# Patient Record
Sex: Male | Born: 1975 | Race: Black or African American | Hispanic: No | Marital: Single | Smoking: Current every day smoker
Health system: Southern US, Community
[De-identification: ages and names within clinical notes are randomized; demographics above are authoritative.]

## PROBLEM LIST (undated history)

## (undated) HISTORY — PX: FRACTURE SURGERY: SHX138

---

## 1999-09-23 ENCOUNTER — Encounter: Payer: Self-pay | Admitting: Emergency Medicine

## 1999-09-23 ENCOUNTER — Emergency Department (HOSPITAL_COMMUNITY): Admission: EM | Admit: 1999-09-23 | Discharge: 1999-09-23 | Payer: Self-pay | Admitting: *Deleted

## 2000-06-30 ENCOUNTER — Emergency Department (HOSPITAL_COMMUNITY): Admission: EM | Admit: 2000-06-30 | Discharge: 2000-06-30 | Payer: Self-pay | Admitting: Emergency Medicine

## 2001-05-25 ENCOUNTER — Emergency Department (HOSPITAL_COMMUNITY): Admission: EM | Admit: 2001-05-25 | Discharge: 2001-05-25 | Payer: Self-pay | Admitting: *Deleted

## 2001-06-21 ENCOUNTER — Emergency Department (HOSPITAL_COMMUNITY): Admission: EM | Admit: 2001-06-21 | Discharge: 2001-06-21 | Payer: Self-pay | Admitting: Emergency Medicine

## 2003-03-16 ENCOUNTER — Emergency Department (HOSPITAL_COMMUNITY): Admission: EM | Admit: 2003-03-16 | Discharge: 2003-03-16 | Payer: Self-pay | Admitting: *Deleted

## 2003-03-16 ENCOUNTER — Encounter: Payer: Self-pay | Admitting: *Deleted

## 2003-05-16 ENCOUNTER — Encounter: Payer: Self-pay | Admitting: *Deleted

## 2003-05-16 ENCOUNTER — Emergency Department (HOSPITAL_COMMUNITY): Admission: EM | Admit: 2003-05-16 | Discharge: 2003-05-16 | Payer: Self-pay | Admitting: *Deleted

## 2003-06-24 ENCOUNTER — Encounter: Payer: Self-pay | Admitting: Emergency Medicine

## 2003-06-24 ENCOUNTER — Emergency Department (HOSPITAL_COMMUNITY): Admission: EM | Admit: 2003-06-24 | Discharge: 2003-06-24 | Payer: Self-pay | Admitting: Emergency Medicine

## 2003-11-22 ENCOUNTER — Emergency Department (HOSPITAL_COMMUNITY): Admission: EM | Admit: 2003-11-22 | Discharge: 2003-11-22 | Payer: Self-pay | Admitting: Emergency Medicine

## 2005-12-16 ENCOUNTER — Emergency Department (HOSPITAL_COMMUNITY): Admission: EM | Admit: 2005-12-16 | Discharge: 2005-12-17 | Payer: Self-pay | Admitting: Emergency Medicine

## 2006-11-08 ENCOUNTER — Emergency Department (HOSPITAL_COMMUNITY): Admission: EM | Admit: 2006-11-08 | Discharge: 2006-11-08 | Payer: Self-pay | Admitting: Emergency Medicine

## 2008-02-24 ENCOUNTER — Emergency Department (HOSPITAL_COMMUNITY): Admission: EM | Admit: 2008-02-24 | Discharge: 2008-02-24 | Payer: Self-pay | Admitting: Emergency Medicine

## 2011-05-28 ENCOUNTER — Encounter: Payer: Self-pay | Admitting: *Deleted

## 2011-05-28 ENCOUNTER — Emergency Department (HOSPITAL_COMMUNITY)
Admission: EM | Admit: 2011-05-28 | Discharge: 2011-05-28 | Disposition: A | Discharge status: Home / Self Care | Payer: Self-pay | Attending: Emergency Medicine | Admitting: Emergency Medicine

## 2011-05-28 DIAGNOSIS — L738 Other specified follicular disorders: Secondary | ICD-10-CM | POA: Insufficient documentation

## 2011-05-28 DIAGNOSIS — F17200 Nicotine dependence, unspecified, uncomplicated: Secondary | ICD-10-CM | POA: Insufficient documentation

## 2011-05-28 MED ORDER — DOXYCYCLINE HYCLATE 100 MG PO TABS
100.0000 mg | ORAL_TABLET | Freq: Once | ORAL | Status: AC
  Administered 2011-05-28: 100 mg via ORAL
  Filled 2011-05-28: qty 1

## 2011-05-28 MED ORDER — ACETAMINOPHEN 325 MG PO TABS
650.0000 mg | ORAL_TABLET | Freq: Once | ORAL | Status: AC
  Administered 2011-05-28: 650 mg via ORAL
  Filled 2011-05-28: qty 2

## 2011-05-28 MED ORDER — DOXYCYCLINE HYCLATE 100 MG PO CAPS: 100.0000 mg | ORAL_CAPSULE | Freq: Two times a day (BID) | ORAL | Status: AC

## 2011-05-28 NOTE — ED Provider Notes (Signed)
History     CSN: [id number] Arrival date & time: 05/28/2011  3:14 AM   Chief Complaint  Patient presents with  . Recurrent Skin Infections     (Include location/radiation/quality/duration/timing/severity/associated sxs/prior treatment) HPI Comments: Seen 0438  Patient is a 35 y.o. male presenting with rash. The history is provided by the patient.  Rash  This is a new (patient with raised red area on chil, under beard. Inducrated, no real fluctuance. Raised  center portion of chin lesion) problem. The current episode started yesterday. The problem has been gradually worsening. There has been no fever. The rash is present on the face. The pain is at a severity of 3/10. The pain is mild. Associated symptoms include itching. He has tried nothing for the symptoms.     History reviewed. No pertinent past medical history.   History reviewed. No pertinent past surgical history.  History reviewed. No pertinent family history.  History  Substance Use Topics  . Smoking status: Current Everyday Smoker  . Smokeless tobacco: Not on file  . Alcohol Use: No      Review of Systems  Skin: Positive for itching and rash.    Allergies  Aspirin  Home Medications  No current outpatient prescriptions on file.  Physical Exam    BP 127/69  Pulse 71  Temp(Src) 98.1 F (36.7 C) (Oral)  Resp 20  Ht 6\' 1"  (1.854 m)  Wt 201 lb (91.173 kg)  BMI 26.52 kg/m2  SpO2 99%  Physical Exam  Nursing note and vitals reviewed. Constitutional: He is oriented to person, place, and time. He appears well-developed and well-nourished.  HENT:  Head: Normocephalic and atraumatic.       See skin exam  Eyes: EOM are normal.  Neck: Normal range of motion.  Cardiovascular: Normal rate and normal heart sounds.   Pulmonary/Chest: Effort normal and breath sounds normal.  Abdominal: Soft.  Musculoskeletal: Normal range of motion.  Neurological: He is alert and oriented to person, place, and time.  Skin:         Patient with short beard, several early follicular abscesses beginning.   Psychiatric: He has a normal mood and affect. His behavior is normal.    ED Course  Procedures      Patient with beard folliculitis. Began treatment with antibiotics. No frank abcess fornation. Patient informed of clinical course, understand medical decision-making process, and agree with plan. MDM Reviewed: nursing note and vitals     Nicoletta Dress. Colon Branch, MD 05/28/11 [phone]

## 2011-05-28 NOTE — ED Notes (Signed)
Pt has a abcess under chin. Pt states he was seen at [facility] earlier and had a ct scan of neck and decided to leave despite having results of ct scan because he wanted to be seen here.

## 2012-06-16 ENCOUNTER — Encounter (HOSPITAL_COMMUNITY): Payer: Self-pay

## 2012-06-16 ENCOUNTER — Emergency Department (HOSPITAL_COMMUNITY)
Admission: EM | Admit: 2012-06-16 | Discharge: 2012-06-16 | Disposition: A | Discharge status: Home / Self Care | Payer: No Typology Code available for payment source | Attending: Emergency Medicine | Admitting: Emergency Medicine

## 2012-06-16 ENCOUNTER — Emergency Department (HOSPITAL_COMMUNITY): Payer: No Typology Code available for payment source

## 2012-06-16 DIAGNOSIS — S139XXA Sprain of joints and ligaments of unspecified parts of neck, initial encounter: Secondary | ICD-10-CM | POA: Insufficient documentation

## 2012-06-16 DIAGNOSIS — S161XXA Strain of muscle, fascia and tendon at neck level, initial encounter: Secondary | ICD-10-CM

## 2012-06-16 DIAGNOSIS — IMO0002 Reserved for concepts with insufficient information to code with codable children: Secondary | ICD-10-CM | POA: Insufficient documentation

## 2012-06-16 DIAGNOSIS — S39012A Strain of muscle, fascia and tendon of lower back, initial encounter: Secondary | ICD-10-CM

## 2012-06-16 MED ORDER — OXYCODONE-ACETAMINOPHEN 5-325 MG PO TABS
1.0000 | ORAL_TABLET | Freq: Once | ORAL | Status: AC
  Administered 2012-06-16: 1 via ORAL
  Filled 2012-06-16: qty 1

## 2012-06-16 MED ORDER — CYCLOBENZAPRINE HCL 5 MG PO TABS: 5.0000 mg | ORAL_TABLET | Freq: Three times a day (TID) | ORAL | Status: DC | PRN

## 2012-06-16 MED ORDER — OXYCODONE-ACETAMINOPHEN 5-325 MG PO TABS: 1.0000 | ORAL_TABLET | ORAL | Status: AC | PRN

## 2012-06-16 MED ORDER — IBUPROFEN 600 MG PO TABS: 600.0000 mg | ORAL_TABLET | Freq: Four times a day (QID) | ORAL | Status: DC | PRN

## 2012-06-16 MED ORDER — OXYCODONE-ACETAMINOPHEN 5-325 MG PO TABS: 1.0000 | ORAL_TABLET | Freq: Once | ORAL | Status: DC

## 2012-06-16 MED ORDER — KETOROLAC TROMETHAMINE 60 MG/2ML IM SOLN: 60.0000 mg | Freq: Once | INTRAMUSCULAR | Status: DC
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
## 2012-06-17 NOTE — ED Provider Notes (Signed)
Medical screening examination/treatment/procedure(s) were performed by non-physician practitioner and as supervising physician I was immediately available for consultation/collaboration.   Glynn Octave, MD 06/17/12 0020

## 2012-12-24 ENCOUNTER — Emergency Department (HOSPITAL_COMMUNITY)
Admission: EM | Admit: 2012-12-24 | Discharge: 2012-12-24 | Disposition: A | Discharge status: Home / Self Care | Payer: Self-pay | Attending: Emergency Medicine | Admitting: Emergency Medicine

## 2012-12-24 ENCOUNTER — Encounter (HOSPITAL_COMMUNITY): Payer: Self-pay

## 2012-12-24 DIAGNOSIS — L0291 Cutaneous abscess, unspecified: Secondary | ICD-10-CM

## 2012-12-24 DIAGNOSIS — F17200 Nicotine dependence, unspecified, uncomplicated: Secondary | ICD-10-CM | POA: Insufficient documentation

## 2012-12-24 DIAGNOSIS — IMO0002 Reserved for concepts with insufficient information to code with codable children: Secondary | ICD-10-CM | POA: Insufficient documentation

## 2012-12-24 DIAGNOSIS — Z872 Personal history of diseases of the skin and subcutaneous tissue: Secondary | ICD-10-CM | POA: Insufficient documentation

## 2012-12-24 DIAGNOSIS — K6289 Other specified diseases of anus and rectum: Secondary | ICD-10-CM | POA: Insufficient documentation

## 2012-12-24 MED ORDER — LIDOCAINE-EPINEPHRINE (PF) 1 %-1:200000 IJ SOLN
10.0000 mL | Freq: Once | INTRAMUSCULAR | Status: AC
  Administered 2012-12-24: 10 mL
  Filled 2012-12-24: qty 10

## 2012-12-24 MED ORDER — SULFAMETHOXAZOLE-TRIMETHOPRIM 800-160 MG PO TABS: 1.0000 | ORAL_TABLET | Freq: Two times a day (BID) | ORAL | Status: DC

## 2012-12-24 NOTE — ED Provider Notes (Signed)
History     CSN: [id number]  Arrival date & time 12/24/12  0223   First MD Initiated Contact with Patient 12/24/12 0235      Chief Complaint  Patient presents with  . Abscess    (Consider location/radiation/quality/duration/timing/severity/associated sxs/prior treatment) HPI Hx per PT. L arm pain, swelling and abscess x 1 week, getting worse, sharp pain not radiating. Had L facial abscess recently that he popped and drained and is now better.  He also has some rectal pain, no blood in stools, no fevers ro chills, no N/V/D or constipation, denies painful BMs. Symptoms mod to severe. Pain worse in armpit with any movement or palpation  History reviewed. No pertinent past medical history.  Past Surgical History  Procedure Laterality Date  . Fracture surgery      No family history on file.  History  Substance Use Topics  . Smoking status: Current Every Day Smoker    Types: Cigarettes  . Smokeless tobacco: Not on file  . Alcohol Use: No      Review of Systems  Constitutional: Negative for fever and chills.  HENT: Negative for neck pain and neck stiffness.   Eyes: Negative for pain.  Respiratory: Negative for shortness of breath.   Cardiovascular: Negative for chest pain.  Gastrointestinal: Negative for abdominal pain.  Genitourinary: Negative for dysuria.  Musculoskeletal: Negative for back pain.  Skin: Negative for rash.  Neurological: Negative for headaches.  All other systems reviewed and are negative.    Allergies  Aspirin  Home Medications   Current Outpatient Rx  Name  Route  Sig  Dispense  Refill  . ibuprofen (ADVIL,MOTRIN) 600 MG tablet   Oral   Take 1 tablet (600 mg total) by mouth every 6 (six) hours as needed for pain.   30 tablet   0   . cyclobenzaprine (FLEXERIL) 5 MG tablet   Oral   Take 1 tablet (5 mg total) by mouth 3 (three) times daily as needed for muscle spasms.   15 tablet   0   . sulfamethoxazole-trimethoprim (SEPTRA DS) 800-160  MG per tablet   Oral   Take 1 tablet by mouth every 12 (twelve) hours.   10 tablet   0     BP 124/72  Pulse 81  Temp(Src) 99.2 F (37.3 C) (Oral)  Resp 16  Ht 6\' 1"  (1.854 m)  Wt 210 lb (95.255 kg)  BMI 27.71 kg/m2  SpO2 97%  Physical Exam  Constitutional: He is oriented to person, place, and time. He appears well-developed and well-nourished.  HENT:  Head: Normocephalic and atraumatic.  Eyes: EOM are normal. Pupils are equal, round, and reactive to light.  Neck: Neck supple.  Cardiovascular: Regular rhythm and intact distal pulses.   Pulmonary/Chest: Effort normal. No respiratory distress.  Genitourinary:  Rectal exam: no mass or tenderness, no fissure or ext hemarrhoids  Musculoskeletal: Normal range of motion. He exhibits no edema.  L axilla with large area of fluctuance, tenderness and surrounding erythema, no poinitng  Neurological: He is alert and oriented to person, place, and time.  Skin: Skin is warm and dry.    ED Course  INCISION AND DRAINAGE Date/Time: 12/24/2012 4:00 AM Performed by: Sunnie Nielsen Authorized by: Sunnie Nielsen Consent: Verbal consent obtained. Risks and benefits: risks, benefits and alternatives were discussed Consent given by: patient Patient understanding: patient states understanding of the procedure being performed Patient consent: the patient's understanding of the procedure matches consent given Procedure consent: procedure consent matches procedure  scheduled Required items: required blood products, implants, devices, and special equipment available Patient identity confirmed: verbally with patient Time out: Immediately prior to procedure a "time out" was called to verify the correct patient, procedure, equipment, support staff and site/side marked as required. Type: abscess Location: L axilla. Anesthesia: local infiltration Local anesthetic: lidocaine 1% with epinephrine Anesthetic total: 2 ml Risk factor: underlying major vessel,  underlying major nerve and coagulopathy Scalpel size: 11 Needle gauge: 22 Incision type: single straight Complexity: complex Drainage: purulent Drainage amount: copious Wound treatment: wound left open Patient tolerance: Patient tolerated the procedure well with no immediate complications.   (including critical care time)    1. Abscess    Plan 48 hour recheck, [facility] referral, ABX and warm soaks  MDM  Large abscess L axilla with some cellulitis, recent La face abscess resolved and now rectal pain without discrete abscess by exam. Strict return precautions verbalized as understood, VS and nursing notes reviewed and considered        Sunnie Nielsen, MD 12/24/12 [phone]

## 2012-12-24 NOTE — ED Notes (Signed)
Pt has large swelling to under left arm/armpit for a week.  Pt also states he has a problem with his rectum, is stinging, painful

## 2013-03-11 ENCOUNTER — Encounter (HOSPITAL_COMMUNITY): Payer: Self-pay | Admitting: *Deleted

## 2013-03-11 ENCOUNTER — Emergency Department (HOSPITAL_COMMUNITY)
Admission: EM | Admit: 2013-03-11 | Discharge: 2013-03-11 | Disposition: A | Discharge status: Home / Self Care | Payer: No Typology Code available for payment source | Attending: Emergency Medicine | Admitting: Emergency Medicine

## 2013-03-11 ENCOUNTER — Emergency Department (HOSPITAL_COMMUNITY): Payer: No Typology Code available for payment source

## 2013-03-11 DIAGNOSIS — S139XXA Sprain of joints and ligaments of unspecified parts of neck, initial encounter: Secondary | ICD-10-CM | POA: Insufficient documentation

## 2013-03-11 DIAGNOSIS — F17200 Nicotine dependence, unspecified, uncomplicated: Secondary | ICD-10-CM | POA: Insufficient documentation

## 2013-03-11 DIAGNOSIS — Y92410 Unspecified street and highway as the place of occurrence of the external cause: Secondary | ICD-10-CM | POA: Insufficient documentation

## 2013-03-11 DIAGNOSIS — S161XXA Strain of muscle, fascia and tendon at neck level, initial encounter: Secondary | ICD-10-CM

## 2013-03-11 DIAGNOSIS — Y9389 Activity, other specified: Secondary | ICD-10-CM | POA: Insufficient documentation

## 2013-03-11 MED ORDER — TRAMADOL HCL 50 MG PO TABS: 50.0000 mg | ORAL_TABLET | Freq: Four times a day (QID) | ORAL | Status: DC | PRN
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
## 2013-05-21 ENCOUNTER — Emergency Department (HOSPITAL_COMMUNITY)
Admission: EM | Admit: 2013-05-21 | Discharge: 2013-05-21 | Disposition: A | Discharge status: Home / Self Care | Payer: Self-pay | Attending: Emergency Medicine | Admitting: Emergency Medicine

## 2013-05-21 ENCOUNTER — Encounter (HOSPITAL_COMMUNITY): Payer: Self-pay | Admitting: Emergency Medicine

## 2013-05-21 DIAGNOSIS — Z791 Long term (current) use of non-steroidal anti-inflammatories (NSAID): Secondary | ICD-10-CM | POA: Insufficient documentation

## 2013-05-21 DIAGNOSIS — L03012 Cellulitis of left finger: Secondary | ICD-10-CM

## 2013-05-21 DIAGNOSIS — F17200 Nicotine dependence, unspecified, uncomplicated: Secondary | ICD-10-CM | POA: Insufficient documentation

## 2013-05-21 DIAGNOSIS — IMO0001 Reserved for inherently not codable concepts without codable children: Secondary | ICD-10-CM | POA: Insufficient documentation

## 2013-05-21 DIAGNOSIS — L03019 Cellulitis of unspecified finger: Secondary | ICD-10-CM | POA: Insufficient documentation
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
## 2013-05-21 NOTE — ED Provider Notes (Signed)
Medical screening examination/treatment/procedure(s) were performed by non-physician practitioner and as supervising physician I was immediately available for consultation/collaboration.  Olivia Mackie, MD 05/21/13 0500

## 2013-05-21 NOTE — ED Notes (Signed)
Pt. reports abscess at left distal middle finger onset Friday , also reproted stung by yellow jacket at left thigh with chronic low back pain .
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
## 2014-05-08 ENCOUNTER — Encounter (HOSPITAL_COMMUNITY): Payer: Self-pay | Admitting: Emergency Medicine

## 2014-05-08 ENCOUNTER — Emergency Department (HOSPITAL_COMMUNITY)
Admission: EM | Admit: 2014-05-08 | Discharge: 2014-05-08 | Disposition: A | Discharge status: Home / Self Care | Payer: No Typology Code available for payment source | Attending: Emergency Medicine | Admitting: Emergency Medicine

## 2014-05-08 DIAGNOSIS — Y92410 Unspecified street and highway as the place of occurrence of the external cause: Secondary | ICD-10-CM | POA: Insufficient documentation

## 2014-05-08 DIAGNOSIS — S0993XA Unspecified injury of face, initial encounter: Secondary | ICD-10-CM | POA: Insufficient documentation

## 2014-05-08 DIAGNOSIS — S199XXA Unspecified injury of neck, initial encounter: Secondary | ICD-10-CM | POA: Diagnosis not present

## 2014-05-08 DIAGNOSIS — F17200 Nicotine dependence, unspecified, uncomplicated: Secondary | ICD-10-CM | POA: Insufficient documentation

## 2014-05-08 DIAGNOSIS — M62838 Other muscle spasm: Secondary | ICD-10-CM

## 2014-05-08 DIAGNOSIS — Y9389 Activity, other specified: Secondary | ICD-10-CM | POA: Diagnosis not present

## 2014-05-08 DIAGNOSIS — Z791 Long term (current) use of non-steroidal anti-inflammatories (NSAID): Secondary | ICD-10-CM | POA: Insufficient documentation

## 2014-05-08 DIAGNOSIS — Z8781 Personal history of (healed) traumatic fracture: Secondary | ICD-10-CM | POA: Insufficient documentation

## 2014-05-08 MED ORDER — CYCLOBENZAPRINE HCL 10 MG PO TABS: 10.0000 mg | ORAL_TABLET | Freq: Two times a day (BID) | ORAL | Status: DC | PRN

## 2014-05-08 NOTE — Discharge Instructions (Signed)
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
## 2014-05-09 NOTE — ED Provider Notes (Signed)
Medical screening examination/treatment/procedure(s) were performed by non-physician practitioner and as supervising physician I was immediately available for consultation/collaboration.  Christopher J. Pollina, MD 05/09/14 1847 

## 2014-07-08 ENCOUNTER — Encounter (HOSPITAL_COMMUNITY): Payer: Self-pay | Admitting: Emergency Medicine

## 2014-07-08 ENCOUNTER — Emergency Department (HOSPITAL_COMMUNITY)
Admission: EM | Admit: 2014-07-08 | Discharge: 2014-07-08 | Disposition: A | Discharge status: Home / Self Care | Payer: No Typology Code available for payment source | Attending: Emergency Medicine | Admitting: Emergency Medicine

## 2014-07-08 DIAGNOSIS — B432 Subcutaneous pheomycotic abscess and cyst: Secondary | ICD-10-CM | POA: Insufficient documentation

## 2014-07-08 DIAGNOSIS — Z791 Long term (current) use of non-steroidal anti-inflammatories (NSAID): Secondary | ICD-10-CM | POA: Insufficient documentation

## 2014-07-08 DIAGNOSIS — L723 Sebaceous cyst: Secondary | ICD-10-CM

## 2014-07-08 DIAGNOSIS — L259 Unspecified contact dermatitis, unspecified cause: Secondary | ICD-10-CM | POA: Insufficient documentation

## 2014-07-08 DIAGNOSIS — Z7952 Long term (current) use of systemic steroids: Secondary | ICD-10-CM | POA: Insufficient documentation

## 2014-07-08 DIAGNOSIS — L0201 Cutaneous abscess of face: Secondary | ICD-10-CM | POA: Insufficient documentation

## 2014-07-08 DIAGNOSIS — Z720 Tobacco use: Secondary | ICD-10-CM | POA: Insufficient documentation

## 2014-07-08 MED ORDER — SULFAMETHOXAZOLE-TRIMETHOPRIM 800-160 MG PO TABS: 1.0000 | ORAL_TABLET | Freq: Two times a day (BID) | ORAL | Status: DC

## 2014-07-08 MED ORDER — TRIAMCINOLONE ACETONIDE 0.1 % EX CREA: 1.0000 "application " | TOPICAL_CREAM | Freq: Three times a day (TID) | CUTANEOUS | Status: DC

## 2014-07-08 MED ORDER — LIDOCAINE HCL (PF) 1 % IJ SOLN
5.0000 mL | Freq: Once | INTRAMUSCULAR | Status: AC
  Administered 2014-07-08: 5 mL via INTRADERMAL
  Filled 2014-07-08: qty 5

## 2014-07-08 NOTE — Discharge Instructions (Signed)
Contact Dermatitis Contact dermatitis is a rash that happens when something touches the skin. You touched something that irritates your skin, or you have allergies to something you touched. HOME CARE   Avoid the thing that caused your rash.  Keep your rash away from hot water, soap, sunlight, chemicals, and other things that might bother it.  Do not scratch your rash.  You can take cool baths to help stop itching.  Only take medicine as told by your doctor.  Keep all doctor visits as told. GET HELP RIGHT AWAY IF:   Your rash is not better after 3 days.  Your rash gets worse.  Your rash is puffy (swollen), tender, red, sore, or warm.  You have problems with your medicine. MAKE SURE YOU:   Understand these instructions.  Will watch your condition.  Will get help right away if you are not doing well or get worse. Document Released: 06/25/2009 Document Revised: 11/20/2011 Document Reviewed: 01/31/2011 [facility] Patient Information 2015 [place], [place]. This information is not intended to replace advice given to you by your health care provider. Make sure you discuss any questions you have with your health care provider.  Epidermal Cyst An epidermal cyst is usually a small, painless lump under the skin. Cysts often occur on the face, neck, stomach, chest, or genitals. The cyst may be filled with a bad smelling paste. Do not pop your cyst. Popping the cyst can cause pain and puffiness (swelling). HOME CARE   Only take medicines as told by your doctor.  Take your medicine (antibiotics) as told. Finish it even if you start to feel better. GET HELP RIGHT AWAY IF:  Your cyst is tender, red, or puffy.  You are not getting better, or you are getting worse.  You have any questions or concerns. MAKE SURE YOU:  Understand these instructions.  Will watch your condition.  Will get help right away if you are not doing well or get worse. Document Released: 10/05/2004 Document  Revised: 02/27/2012 Document Reviewed: 03/06/2011 [facility]Exit[facility] Patient Information 2015 [place], [place]. This information is not intended to replace advice given to you by your health care provider. Make sure you discuss any questions you have with your health care provider.

## 2014-07-08 NOTE — ED Notes (Signed)
Swollen area to rt side of face.for 3 mos

## 2014-07-11 NOTE — ED Provider Notes (Signed)
Medical screening examination/treatment/procedure(s) were performed by non-physician practitioner and as supervising physician I was immediately available for consultation/collaboration.   EKG Interpretation None        Shakeena Kafer L Freeda Spivey, MD 07/11/14 1515 

## 2014-07-11 NOTE — ED Provider Notes (Signed)
CSN: [id number]     Arrival date & time 07/08/14  2026 History   First MD Initiated Contact with Patient 07/08/14 2115     Chief Complaint  Patient presents with  . Abscess     (Consider location/radiation/quality/duration/timing/severity/associated sxs/prior Treatment) HPI   Darren White is a 38 y.o. male who presents to the Emergency Department complaining of swollen , painful "knot" to the right side of his face for three months.  He states the swelling and pain will "come and go" but has been worsening for 2 weeks.  He reports having a similar area on the left neck that was drained and has never returned. He denies fever, chills, jaw pain, difficulty opening or closing his mouth.  He has not tried anything for the symptoms.   Patient also reports having a red "patch" to his lower abdomen that he reports is itching, scaling and has been present for months.    History reviewed. No pertinent past medical history. Past Surgical History  Procedure Laterality Date  . Fracture surgery     History reviewed. No pertinent family history. History  Substance Use Topics  . Smoking status: Current Every Day Smoker    Types: Cigarettes  . Smokeless tobacco: Not on file  . Alcohol Use: No    Review of Systems  Constitutional: Negative for fever and chills.  Gastrointestinal: Negative for nausea and vomiting.  Musculoskeletal: Negative for arthralgias and joint swelling.  Skin: Positive for color change.       Abscess   Hematological: Negative for adenopathy.  All other systems reviewed and are negative.     Allergies  Aspirin and Tramadol  Home Medications   Prior to Admission medications   Medication Sig Start Date End Date Taking? Authorizing Provider  cyclobenzaprine (FLEXERIL) 10 MG tablet Take 1 tablet (10 mg total) by mouth 2 (two) times daily as needed for muscle spasms. 05/08/14   Jimmye Normanavid John Smith, NP  naproxen sodium (ANAPROX) 220 MG tablet Take 440 mg by mouth 2  (two) times daily with a meal.    Historical Provider, MD  sulfamethoxazole-trimethoprim (SEPTRA DS) 800-160 MG per tablet Take 1 tablet by mouth 2 (two) times daily. For 10 days 07/08/14   Shanai Lartigue L. Francena Zender, PA-C  triamcinolone cream (KENALOG) 0.1 % Apply 1 application topically 3 (three) times daily. 07/08/14   Asako Saliba L. Avrian Delfavero, PA-C   BP 133/65  Pulse 62  Temp(Src) 98.2 F (36.8 C) (Oral)  Resp 20  Ht 6\' 1"  (1.854 m)  Wt 212 lb (96.163 kg)  BMI 27.98 kg/m2  SpO2 100% Physical Exam  Nursing note and vitals reviewed. Constitutional: He is oriented to person, place, and time. He appears well-developed and well-nourished. No distress.  HENT:  Head: Normocephalic and atraumatic.  Mouth/Throat: Oropharynx is clear and moist.  Neck: Normal range of motion. Neck supple.  Cardiovascular: Normal rate, regular rhythm and normal heart sounds.   No murmur heard. Pulmonary/Chest: Effort normal and breath sounds normal. No respiratory distress.  Neurological: He is alert and oriented to person, place, and time. He exhibits normal muscle tone. Coordination normal.  Skin: Skin is warm and dry. There is erythema.  Localized 2 cm fluctuant nodule to the right cheek.  No surrounding erythema, or drainage.  No facial edema.  Localized erythematous plaque to the lower mid abdomen.  Scaling present and chronic appearing.  No edema.      ED Course  Procedures (including critical care time) Labs Review Labs Reviewed -  No data to display  Imaging Review No results found.   EKG Interpretation None     INCISION AND DRAINAGE Performed by: Maxwell CaulRIPLETT,Emelin Dascenzo L. Consent: Verbal consent obtained. Risks and benefits: risks, benefits and alternatives were discussed Type: abscess  Body area: right face  Anesthesia: local infiltration  Incision was made with a #11 scalpel.  Local anesthetic: lidocaine 1 % w/o epinephrine  Anesthetic total: 1 ml  Complexity: complex Blunt dissection to break up  loculations  Drainage: purulent  Drainage amount: moderate  Packing material: none  Patient tolerance: Patient tolerated the procedure well with no immediate complications.    MDM   Final diagnoses:  Sebaceous cyst  Contact dermatitis    Pt is well appearing, non-toxic.  Sebaceous cyst of the skin to the right face and likely nickel dermatitis to the lower abdomen given that area is exposed to his metal belt buckle.  Rx for triamcinolone and bactrim.  Advised to return here if needed.      Cai Flott L. Dainel Arcidiacono, PA-C 07/11/14 1406

## 2014-08-20 ENCOUNTER — Emergency Department (HOSPITAL_COMMUNITY)
Admission: EM | Admit: 2014-08-20 | Discharge: 2014-08-20 | Disposition: A | Discharge status: Home / Self Care | Payer: No Typology Code available for payment source | Attending: Emergency Medicine | Admitting: Emergency Medicine

## 2014-08-20 ENCOUNTER — Encounter (HOSPITAL_COMMUNITY): Payer: Self-pay

## 2014-08-20 DIAGNOSIS — K610 Anal abscess: Secondary | ICD-10-CM | POA: Insufficient documentation

## 2014-08-20 DIAGNOSIS — Z720 Tobacco use: Secondary | ICD-10-CM | POA: Insufficient documentation

## 2014-08-20 MED ORDER — PROMETHAZINE HCL 12.5 MG PO TABS
12.5000 mg | ORAL_TABLET | Freq: Once | ORAL | Status: AC
  Administered 2014-08-20: 12.5 mg via ORAL
  Filled 2014-08-20: qty 1

## 2014-08-20 MED ORDER — CIPROFLOXACIN HCL 500 MG PO TABS: 500.0000 mg | ORAL_TABLET | Freq: Two times a day (BID) | ORAL | Status: DC

## 2014-08-20 MED ORDER — METRONIDAZOLE 500 MG PO TABS: 500.0000 mg | ORAL_TABLET | Freq: Two times a day (BID) | ORAL | Status: DC

## 2014-08-20 MED ORDER — DOXYCYCLINE HYCLATE 100 MG PO TABS
100.0000 mg | ORAL_TABLET | Freq: Once | ORAL | Status: AC
Start: 2014-08-20 — End: 2014-08-20
  Administered 2014-08-20: 100 mg via ORAL
  Filled 2014-08-20: qty 1

## 2014-08-20 MED ORDER — CIPROFLOXACIN HCL 250 MG PO TABS
500.0000 mg | ORAL_TABLET | Freq: Once | ORAL | Status: AC
  Administered 2014-08-20: 500 mg via ORAL
  Filled 2014-08-20: qty 2

## 2014-08-20 MED ORDER — HYDROCODONE-ACETAMINOPHEN 7.5-325 MG PO TABS: 1.0000 | ORAL_TABLET | ORAL | Status: DC | PRN

## 2014-08-20 MED ORDER — METRONIDAZOLE 500 MG PO TABS
500.0000 mg | ORAL_TABLET | Freq: Once | ORAL | Status: AC
  Administered 2014-08-20: 500 mg via ORAL
  Filled 2014-08-20: qty 1

## 2014-08-20 MED ORDER — ONDANSETRON HCL 4 MG PO TABS
4.0000 mg | ORAL_TABLET | Freq: Once | ORAL | Status: AC
  Administered 2014-08-20: 4 mg via ORAL
  Filled 2014-08-20: qty 1

## 2014-08-20 MED ORDER — MORPHINE SULFATE 10 MG/ML IJ SOLN
10.0000 mg | Freq: Once | INTRAMUSCULAR | Status: AC
  Administered 2014-08-20: 10 mg via INTRAMUSCULAR
  Filled 2014-08-20: qty 1

## 2014-08-20 NOTE — ED Notes (Signed)
Patient c/o of boil/hemorrhoid which first appeared 08/14/14. Pt c/o of pain/soreness in the area which prevents him from sitting down. Denies any blood after BMs, denies itching. Pt used Prep H cream and suppository with no relief.

## 2014-08-20 NOTE — Care Management Note (Signed)
ED/CM noted patient did not have health insurance and/or PCP listed in the computer.  Patient was asleep. [facility] resource handout with information on the clinics, food pantries, and the handout for new health insurance sign-up were left at bedside for the patient, along with a Rx discount card.

## 2014-08-20 NOTE — Discharge Instructions (Signed)
Please soak in a tub of warm Epsom salt water twice a day until this issue resolves. Please call Dr. Lovell SheehanJenkins tomorrow for appointment in the office on Tuesday. Please use Tylenol for mild pain, use Norco for more severe pain. Please use stool softener of your choice until this issue has resolved. Please use Cipro 2 times daily and Flagyl 2 times daily with food. Please do not use any form of alcohol while taking the Flagyl. Please return to the emergency department if any high fever, unusual fatigue or weakness, or signs of advancing infection. Perianal Abscess An abscess is an infected area that contains a collection of pus and debris. A perianal abscess is one that occurs in the perineal area, which is the area between the anus and the scrotum in males and between the anus and the vagina in females. Perianal abscesses can vary in size. Without treatment, a perianal abscess can become larger and cause other problems. CAUSES  Glands in the perineal area can become plugged up with debris. When this happens, an abscess may form.  SIGNS AND SYMPTOMS  The most common symptoms of a perianal abscess are:  Swelling and redness in the area of the abscess. The redness may go beyond the abscess and appear as a red streak on the skin.  Pain in the area of the abscess. Other possible symptoms include:   A visible lump or a lump that can be felt when touching the area and is usually painful.  Bleeding or pus-like discharge from the area.  Fever.  General weakness. DIAGNOSIS  Your health care provider will take a medical history and examine the area. This may involve examining the rectal area with a gloved hand (digital rectal exam). For women, it may require a careful vaginal exam. Sometimes, the health care provider needs to look into the rectum using a probe or scope. TREATMENT  Treatment often requires making a cut (incision) in the abscess to drain the pus. This can sometimes be done in your health  care provider's office or an emergency department after giving you medicine to numb the area (local anesthetic). For larger or deeper abscesses, surgery may be required to drain the abscess. Antibiotic medicines are sometimes given if there is infection of the surrounding tissue (cellulitis). In some cases, gauze is packed into the abscess to continue draining the area. Frequent sitz baths may be recommended to help the wound heal and to reduce the chance of the abscess coming back. HOME CARE INSTRUCTIONS   Only take over-the-counter or prescription medicines for pain, fever, or discomfort as directed by your health care provider.  Take antibiotic medicine as directed. Make sure you finish it even if you start to feel better.  If gauze is used in the abscess, follow your health care provider's instructions for removing or changing the gauze. It can usually be removed in 2-3 days.  If one or more drains have been placed in the abscess cavity, be careful not to pull at them. Your health care provider will tell you how long they need to remain in place.  Take warm sitz baths 3-4 times a day and after bowel movements. This will help reduce pain and swelling.  Keep the skin around the abscess clean and dry. Avoid cleaning the area too much.  Avoid scratching the abscess area.  Avoid using colored or perfumed toilet papers. SEEK MEDICAL CARE IF:   You have trouble having a bowel movement or passing urine.  Your pain or  swelling in the affected area does not seem to be improving.  The gauze packing or the drains come out before the planned time. SEEK IMMEDIATE MEDICAL CARE IF:   You have problems moving or using your legs.  You have severe or increasing pain.  Your swelling in the affected area suddenly gets worse.  You have a large increase in bleeding or passing of pus.  You have chills or a fever. MAKE SURE YOU:   Understand these instructions.  Will watch your condition.  Will  get help right away if you are not doing well or get worse. Document Released: 10/04/2006 Document Revised: 06/18/2013 Document Reviewed: 04/09/2013 [facility]Exit[facility] Patient Information 2015 [place]Exit[place], [place]. This information is not intended to replace advice given to you by your health care provider. Make sure you discuss any questions you have with your health care provider.

## 2014-08-20 NOTE — ED Provider Notes (Signed)
CSN: [id number]     Arrival date & time 08/20/14  1230 History   First MD Initiated Contact with Patient 08/20/14 1430     Chief Complaint  Patient presents with  . Abscess     (Consider location/radiation/quality/duration/timing/severity/associated sxs/prior Treatment) Patient is a 38 y.o. male presenting with abscess. The history is provided by the patient.  Abscess Location:  Ano-genital Ano-genital abscess location:  Anus Duration:  5 days Progression:  Worsening Chronicity:  New Context: not diabetes and not immunosuppression   Relieved by:  Nothing Exacerbated by: sitting, BM, or palpation. Associated symptoms: no fever and no vomiting   Risk factors: no hx of MRSA and no prior abscess     History reviewed. No pertinent past medical history. Past Surgical History  Procedure Laterality Date  . Fracture surgery     History reviewed. No pertinent family history. History  Substance Use Topics  . Smoking status: Current Every Day Smoker    Types: Cigarettes  . Smokeless tobacco: Not on file  . Alcohol Use: No    Review of Systems  Constitutional: Negative for fever and activity change.       All ROS Neg except as noted in HPI  HENT: Negative for nosebleeds.   Eyes: Negative for photophobia and discharge.  Respiratory: Negative for cough, shortness of breath and wheezing.   Cardiovascular: Negative for chest pain and palpitations.  Gastrointestinal: Negative for vomiting, abdominal pain and blood in stool.  Genitourinary: Negative for dysuria, frequency and hematuria.       Anal pain  Musculoskeletal: Negative for back pain, arthralgias and neck pain.  Skin: Negative.   Neurological: Negative for dizziness, seizures and speech difficulty.  Psychiatric/Behavioral: Negative for hallucinations and confusion.      Allergies  Aspirin and Tramadol  Home Medications   Prior to Admission medications   Medication Sig Start Date End Date Taking? Authorizing  Provider  cyclobenzaprine (FLEXERIL) 10 MG tablet Take 1 tablet (10 mg total) by mouth 2 (two) times daily as needed for muscle spasms. Patient not taking: Reported on 08/20/2014 05/08/14   Jimmye Normanavid John Smith, NP  naproxen sodium (ANAPROX) 220 MG tablet Take 440 mg by mouth 2 (two) times daily as needed (pain).     Historical Provider, MD  sulfamethoxazole-trimethoprim (SEPTRA DS) 800-160 MG per tablet Take 1 tablet by mouth 2 (two) times daily. For 10 days Patient not taking: Reported on 08/20/2014 07/08/14   Tammy L. Triplett, PA-C  triamcinolone cream (KENALOG) 0.1 % Apply 1 application topically 3 (three) times daily. Patient not taking: Reported on 08/20/2014 07/08/14   Tammy L. Triplett, PA-C   BP 115/78 mmHg  Pulse 76  Temp(Src) 99.5 F (37.5 C) (Oral)  Resp 16  Ht 6\' 1"  (1.854 m)  Wt 210 lb (95.255 kg)  BMI 27.71 kg/m2  SpO2 100% Physical Exam  Constitutional: He is oriented to person, place, and time. He appears well-developed and well-nourished.  Non-toxic appearance.  HENT:  Head: Normocephalic.  Right Ear: Tympanic membrane and external ear normal.  Left Ear: Tympanic membrane and external ear normal.  Eyes: EOM and lids are normal. Pupils are equal, round, and reactive to light.  Neck: Normal range of motion. Neck supple. Carotid bruit is not present.  Cardiovascular: Normal rate, regular rhythm, normal heart sounds, intact distal pulses and normal pulses.   Pulmonary/Chest: Breath sounds normal. No respiratory distress.  Abdominal: Soft. Bowel sounds are normal. There is no tenderness. There is no guarding.  Genitourinary:  Perirectal abscess noted at the 2:00 to 4:00 position. Tender to palpation. No red streaking noted. The abscess area is draining.  Musculoskeletal: Normal range of motion.  Lymphadenopathy:       Head (right side): No submandibular adenopathy present.       Head (left side): No submandibular adenopathy present.    He has no cervical adenopathy.    Neurological: He is alert and oriented to person, place, and time. He has normal strength. No cranial nerve deficit or sensory deficit.  Skin: Skin is warm and dry.  Psychiatric: He has a normal mood and affect. His speech is normal.  Nursing note and vitals reviewed.   ED Course  Case discussed with Dr Lovell SheehanJenkins. Pt to be placed on flagyl and cipro. He will see pt in the office unless symptoms change and pt need to return to the ED sooner.  Procedures (including critical care time) Labs Review Labs Reviewed - No data to display  Imaging Review No results found.   EKG Interpretation None      MDM  Temp 99.5, vitals o/w wnl.  Perianal abscess noted with some drainage. Pt awake and alert. Non-toxic appearing. Pt to see Dr Lovell SheehanJenkins in the office for management. He will return to the ED if condition worsens. Rx for norco 7.5 , cipro, and flagyl given to the patient. Pt to use warm tub soaks 2 or 3 times daily.   Final diagnoses:  Perianal abscess    **I have reviewed nursing notes, vital signs, and all appropriate lab and imaging results for this patient.Kathie Dike*    Wilburta Milbourn M Zach Tietje, PA-C 08/21/14 1701  Donnetta HutchingBrian Cook, MD 08/22/14 [phone]

## 2014-08-23 ENCOUNTER — Emergency Department (HOSPITAL_COMMUNITY)
Admission: EM | Admit: 2014-08-23 | Discharge: 2014-08-23 | Disposition: A | Discharge status: Home / Self Care | Payer: No Typology Code available for payment source | Attending: Emergency Medicine | Admitting: Emergency Medicine

## 2014-08-23 ENCOUNTER — Encounter (HOSPITAL_COMMUNITY): Payer: Self-pay

## 2014-08-23 DIAGNOSIS — Z791 Long term (current) use of non-steroidal anti-inflammatories (NSAID): Secondary | ICD-10-CM | POA: Insufficient documentation

## 2014-08-23 DIAGNOSIS — Z792 Long term (current) use of antibiotics: Secondary | ICD-10-CM | POA: Insufficient documentation

## 2014-08-23 DIAGNOSIS — K611 Rectal abscess: Secondary | ICD-10-CM

## 2014-08-23 DIAGNOSIS — Z720 Tobacco use: Secondary | ICD-10-CM | POA: Insufficient documentation

## 2014-08-23 DIAGNOSIS — Z7952 Long term (current) use of systemic steroids: Secondary | ICD-10-CM | POA: Insufficient documentation

## 2014-08-23 NOTE — ED Notes (Signed)
Patient seen here on 12/10 diagnosed with perirectal abscess. Per patient started on 12/04. Patient reports abscess has increased in size and now he is able to have BM or void. Patient to have surgery on Tuesday but states "I can't wait that long. Patient unable to sit in triage.

## 2014-08-23 NOTE — ED Provider Notes (Signed)
CSN: [id number]     Arrival date & time 08/23/14  1331 History  This chart was scribed for Toy BakerAnthony T Maxen Rowland, MD by Tonye RoyaltyJoshua Chen, ED Scribe. This patient was seen in room APA03/APA03 and the patient's care was started at 3:15 PM.    Chief Complaint  Patient presents with  . Abscess   The history is provided by the patient. No language interpreter was used.    HPI Comments: Darren White is a 38 y.o. male who presents to the Emergency Department complaining of perianal abscess with onset 9 days ago. He was evaluated here for it 3 days ago; he did not have any imaging studies at that time. He is scheduled for day surgery with Dr. Lovell SheehanJenkins in 2 days. He states he returned because he is having continued pain, increase in abscess size, and inability to have a BM. He states it has not been draining at home. He states he has been using the antibiotics prescribed to him from here. He notes he has hot flashes but denies persistent fever or chills.  History reviewed. No pertinent past medical history. Past Surgical History  Procedure Laterality Date  . Fracture surgery     No family history on file. History  Substance Use Topics  . Smoking status: Current Every Day Smoker    Types: Cigarettes  . Smokeless tobacco: Not on file  . Alcohol Use: No    Review of Systems  Constitutional: Negative for fever and chills.  Genitourinary:       Perianal abscess      Allergies  Aspirin and Tramadol  Home Medications   Prior to Admission medications   Medication Sig Start Date End Date Taking? Authorizing Provider  ciprofloxacin (CIPRO) 500 MG tablet Take 1 tablet (500 mg total) by mouth 2 (two) times daily. 08/20/14   Kathie DikeHobson M Bryant, PA-C  cyclobenzaprine (FLEXERIL) 10 MG tablet Take 1 tablet (10 mg total) by mouth 2 (two) times daily as needed for muscle spasms. Patient not taking: Reported on 08/20/2014 05/08/14   Jimmye Normanavid John Smith, NP  HYDROcodone-acetaminophen [facility](NORCO) 7.5-325 MG per tablet  Take 1 tablet by mouth every 4 (four) hours as needed. 08/20/14   Kathie DikeHobson M Bryant, PA-C  metroNIDAZOLE (FLAGYL) 500 MG tablet Take 1 tablet (500 mg total) by mouth 2 (two) times daily. 08/20/14   Kathie DikeHobson M Bryant, PA-C  naproxen sodium (ANAPROX) 220 MG tablet Take 440 mg by mouth 2 (two) times daily as needed (pain).     Historical Provider, MD  sulfamethoxazole-trimethoprim (SEPTRA DS) 800-160 MG per tablet Take 1 tablet by mouth 2 (two) times daily. For 10 days Patient not taking: Reported on 08/20/2014 07/08/14   Tammy L. Triplett, PA-C  triamcinolone cream (KENALOG) 0.1 % Apply 1 application topically 3 (three) times daily. Patient not taking: Reported on 08/20/2014 07/08/14   Tammy L. Triplett, PA-C   BP 146/89 mmHg  Pulse 74  Temp(Src) 98.6 F (37 C) (Oral)  Resp 20  Ht 6\' 1"  (1.854 m)  Wt 210 lb (95.255 kg)  BMI 27.71 kg/m2  SpO2 100% Physical Exam  Constitutional: He is oriented to person, place, and time. He appears well-developed and well-nourished.  Non-toxic appearance. No distress.  HENT:  Head: Normocephalic and atraumatic.  Eyes: Conjunctivae, EOM and lids are normal. Pupils are equal, round, and reactive to light.  Neck: Normal range of motion. Neck supple. No tracheal deviation present. No thyroid mass present.  Cardiovascular: Normal rate, regular rhythm and normal heart  sounds.  Exam reveals no gallop.   No murmur heard. Pulmonary/Chest: Effort normal and breath sounds normal. No stridor. No respiratory distress. He has no decreased breath sounds. He has no wheezes. He has no rhonchi. He has no rales.  Abdominal: Soft. Normal appearance and bowel sounds are normal. He exhibits no distension. There is no tenderness. There is no rebound and no CVA tenderness.  Genitourinary:  At the 4 io clock region of the perianal area active copuiouis amounts of purulent drainage withotu aurrounding erythema oit crepitus noted  Musculoskeletal: Normal range of motion. He exhibits no  edema or tenderness.  Neurological: He is alert and oriented to person, place, and time. He has normal strength. No cranial nerve deficit or sensory deficit. GCS eye subscore is 4. GCS verbal subscore is 5. GCS motor subscore is 6.  Skin: Skin is warm and dry. No abrasion and no rash noted.  Psychiatric: He has a normal mood and affect. His speech is normal and behavior is normal.  Nursing note and vitals reviewed.   ED Course  Procedures (including critical care time)  DIAGNOSTIC STUDIES: Oxygen Saturation is 100% on room air, normal by my interpretation.    COORDINATION OF CARE: 3:24 PM Abscess began draining during exam. Discussed treatment plan with patient at beside, including consult with Dr. Lovell SheehanJenkins. The patient agrees with the plan and has no further questions at this time.   Labs Review Labs Reviewed - No data to display  Imaging Review No results found.   EKG Interpretation None      MDM   Final diagnoses:  None    I personally performed the services described in this documentation, which was scribed in my presence. The recorded information has been reviewed and is accurate.  Spoke with general surgeon on call, Dr. Lovell SheehanJenkins, and he will see the patient in 2 days for follow-up.  Toy BakerAnthony T Daisy Mcneel, MD 08/23/14 [phone]

## 2014-08-23 NOTE — Discharge Instructions (Signed)
Keep your appointment with Dr. Lovell SheehanJenkins for Tuesday Peri-Rectal Abscess Your caregiver has diagnosed you as having a peri-rectal abscess. This is an infected area near the rectum that is filled with pus. If the abscess is near the surface of the skin, your caregiver may open (incise) the area and drain the pus. HOME CARE INSTRUCTIONS   If your abscess was opened up and drained. A small piece of gauze may be placed in the opening so that it can drain. Do not remove the gauze unless directed by your caregiver.  A loose dressing may be placed over the abscess site. Change the dressing as often as necessary to keep it clean and dry.  After the drain is removed, the area may be washed with a gentle antiseptic (soap) four times per day.  A warm sitz bath, warm packs or heating pad may be used for pain relief, taking care not to burn yourself.  Return for a wound check in 1 day or as directed.  An "inflatable doughnut" may be used for sitting with added comfort. These can be purchased at a drugstore or medical supply house.  To reduce pain and straining with bowel movements, eat a high fiber diet with plenty of fruits and vegetables. Use stool softeners as recommended by your caregiver. This is especially important if narcotic type pain medications were prescribed as these may cause marked constipation.  Only take over-the-counter or prescription medicines for pain, discomfort, or fever as directed by your caregiver. SEEK IMMEDIATE MEDICAL CARE IF:   You have increasing pain that is not controlled by medication.  There is increased inflammation (redness), swelling, bleeding, or drainage from the area.  An oral temperature above 102 F (38.9 C) develops.  You develop chills or generalized malaise (feel lethargic or feel "washed out").  You develop any new symptoms (problems) you feel may be related to your present problem. Document Released: 08/25/2000 Document Revised: 11/20/2011 Document  Reviewed: 08/25/2008 [facility] Patient Information 2015 [place]. This information is not intended to replace advice given to you by your health care provider. Make sure you discuss any questions you have with your health care provider.

## 2015-03-24 ENCOUNTER — Emergency Department (HOSPITAL_COMMUNITY)
Admission: EM | Admit: 2015-03-24 | Discharge: 2015-03-25 | Disposition: A | Discharge status: Home / Self Care | Payer: Self-pay | Attending: Emergency Medicine | Admitting: Emergency Medicine

## 2015-03-24 ENCOUNTER — Encounter (HOSPITAL_COMMUNITY): Payer: Self-pay | Admitting: Emergency Medicine

## 2015-03-24 DIAGNOSIS — Z720 Tobacco use: Secondary | ICD-10-CM | POA: Insufficient documentation

## 2015-03-24 DIAGNOSIS — K611 Rectal abscess: Secondary | ICD-10-CM | POA: Insufficient documentation

## 2015-03-24 MED ORDER — OXYCODONE-ACETAMINOPHEN 5-325 MG PO TABS
1.0000 | ORAL_TABLET | Freq: Once | ORAL | Status: AC
  Administered 2015-03-24: 1 via ORAL
  Filled 2015-03-24: qty 1

## 2015-03-24 MED ORDER — LIDOCAINE HCL (PF) 2 % IJ SOLN
10.0000 mL | Freq: Once | INTRAMUSCULAR | Status: AC
  Administered 2015-03-24: 10 mL via INTRADERMAL
  Filled 2015-03-24: qty 10

## 2015-03-24 MED ORDER — SULFAMETHOXAZOLE-TRIMETHOPRIM 800-160 MG PO TABS
1.0000 | ORAL_TABLET | Freq: Once | ORAL | Status: AC
  Administered 2015-03-24: 1 via ORAL
  Filled 2015-03-24: qty 1

## 2015-03-24 MED ORDER — OXYCODONE-ACETAMINOPHEN 5-325 MG PO TABS: 1.0000 | ORAL_TABLET | ORAL | Status: DC | PRN

## 2015-03-24 MED ORDER — SULFAMETHOXAZOLE-TRIMETHOPRIM 800-160 MG PO TABS: 1.0000 | ORAL_TABLET | Freq: Two times a day (BID) | ORAL | Status: AC

## 2015-03-24 NOTE — Discharge Instructions (Signed)
Peri-Rectal Abscess  Your caregiver has diagnosed you as having a peri-rectal abscess. This is an infected area near the rectum that is filled with pus. If the abscess is near the surface of the skin, your caregiver may open (incise) the area and drain the pus.  HOME CARE INSTRUCTIONS    If your abscess was opened up and drained. A small piece of gauze may be placed in the opening so that it can drain. Do not remove the gauze unless directed by your caregiver.   A loose dressing may be placed over the abscess site. Change the dressing as often as necessary to keep it clean and dry.   After the drain is removed, the area may be washed with a gentle antiseptic (soap) four times per day.   A warm sitz bath, warm packs or heating pad may be used for pain relief, taking care not to burn yourself.   Return for a wound check in 1 day or as directed.   An "inflatable doughnut" may be used for sitting with added comfort. These can be purchased at a drugstore or medical supply house.   To reduce pain and straining with bowel movements, eat a high fiber diet with plenty of fruits and vegetables. Use stool softeners as recommended by your caregiver. This is especially important if narcotic type pain medications were prescribed as these may cause marked constipation.   Only take over-the-counter or prescription medicines for pain, discomfort, or fever as directed by your caregiver.  SEEK IMMEDIATE MEDICAL CARE IF:    You have increasing pain that is not controlled by medication.   There is increased inflammation (redness), swelling, bleeding, or drainage from the area.   An oral temperature above 102 F (38.9 C) develops.   You develop chills or generalized malaise (feel lethargic or feel "washed out").   You develop any new symptoms (problems) you feel may be related to your present problem.  Document Released: 08/25/2000 Document Revised: 11/20/2011 Document Reviewed: 08/25/2008  ExitCare Patient Information  2015 [employer]. This information is not intended to replace advice given to you by your health care provider. Make sure you discuss any questions you have with your health care provider.

## 2015-03-24 NOTE — ED Notes (Signed)
Pt c/o abscess to the rectum x one week.

## 2015-03-24 NOTE — ED Provider Notes (Signed)
CSN: [id number]     Arrival date & time 03/24/15  2216 History   First MD Initiated Contact with Patient 03/24/15 2235     Chief Complaint  Patient presents with  . Abscess     (Consider location/radiation/quality/duration/timing/severity/associated sxs/prior Treatment) HPI   Curly RimDaniel L Colocho is a 39 y.o. male who presents to the Emergency Department complaining of abscess to his rectum for one week.  He reports history of same last winter with spontaneous drainage and resolution after starting antibiotics. He was seen by a general surgeon for follow-up but did not have any surgical procedures.  Tonight, he reports increasing pain to his rectum with sitting.  He denies fever, chills, abdominal pain, vomiting and redness.  He also denies immunosuppression and diabetes.   History reviewed. No pertinent past medical history. Past Surgical History  Procedure Laterality Date  . Fracture surgery     No family history on file. History  Substance Use Topics  . Smoking status: Current Every Day Smoker    Types: Cigarettes  . Smokeless tobacco: Not on file  . Alcohol Use: Yes    Review of Systems  Constitutional: Negative for fever and chills.  Gastrointestinal: Negative for nausea and vomiting.  Musculoskeletal: Negative for joint swelling and arthralgias.  Skin: Positive for color change.       Abscess   Hematological: Negative for adenopathy.  All other systems reviewed and are negative.     Allergies  Aspirin and Tramadol  Home Medications   Prior to Admission medications   Medication Sig Start Date End Date Taking? Authorizing Provider  ciprofloxacin (CIPRO) 500 MG tablet Take 1 tablet (500 mg total) by mouth 2 (two) times daily. Patient not taking: Reported on 03/24/2015 08/20/14   Ivery QualeHobson Bryant, PA-C  cyclobenzaprine (FLEXERIL) 10 MG tablet Take 1 tablet (10 mg total) by mouth 2 (two) times daily as needed for muscle spasms. Patient not taking: Reported on 08/20/2014  05/08/14   Felicie Mornavid Smith, NP  HYDROcodone-acetaminophen (NORCO) 7.5-325 MG per tablet Take 1 tablet by mouth every 4 (four) hours as needed. Patient not taking: Reported on 03/24/2015 08/20/14   Ivery QualeHobson Bryant, PA-C  metroNIDAZOLE (FLAGYL) 500 MG tablet Take 1 tablet (500 mg total) by mouth 2 (two) times daily. Patient not taking: Reported on 03/24/2015 08/20/14   Ivery QualeHobson Bryant, PA-C  naproxen sodium (ANAPROX) 220 MG tablet Take 440 mg by mouth 2 (two) times daily as needed (pain).     Historical Provider, MD  sulfamethoxazole-trimethoprim (SEPTRA DS) 800-160 MG per tablet Take 1 tablet by mouth 2 (two) times daily. For 10 days Patient not taking: Reported on 08/20/2014 07/08/14   Quanda Pavlicek, PA-C  triamcinolone cream (KENALOG) 0.1 % Apply 1 application topically 3 (three) times daily. Patient not taking: Reported on 08/20/2014 07/08/14   Grabiel Schmutz, PA-C   BP 113/79 mmHg  Pulse 69  Temp(Src) 98.1 F (36.7 C) (Oral)  Resp 20  Ht 6\' 1"  (1.854 m)  Wt 210 lb (95.255 kg)  BMI 27.71 kg/m2  SpO2 100% Physical Exam  Constitutional: He is oriented to person, place, and time. He appears well-developed and well-nourished. No distress.  HENT:  Head: Normocephalic and atraumatic.  Cardiovascular: Normal rate, regular rhythm and normal heart sounds.   No murmur heard. Pulmonary/Chest: Effort normal and breath sounds normal. No respiratory distress.  Genitourinary:  Localized fluctuance at 4:00 o clock position to the right of the anus.  No drainage or erythema  Neurological: He is alert and oriented  to person, place, and time. He exhibits normal muscle tone. Coordination normal.  Skin: Skin is warm and dry. No erythema.  Nursing note and vitals reviewed.   ED Course  Procedures (including critical care time) Labs Review Labs Reviewed - No data to display  Imaging Review No results found.   EKG Interpretation None       INCISION AND DRAINAGE Performed by: Maxwell Caul. Consent: Verbal consent obtained. Risks and benefits: risks, benefits and alternatives were discussed Type: abscess  Body area: perirectal  Anesthesia: local infiltration  Incision was made with a #11 scalpel.  Local anesthetic: lidocaine 2 % w/o epinephrine  Anesthetic total: 2 ml  Complexity: complex Blunt dissection to break up loculations  Drainage: purulent  Drainage amount: moderate  Packing material: none  Patient tolerance: Patient tolerated the procedure well with no immediate complications.     MDM   Final diagnoses:  Perirectal abscess    Pt is well appearing.  Non-toxic.  Vitals stable.  Recurrent perirectal abscess with successful I&D.  Pt has seen general surgeon, Dr. Lovell Sheehan previously for same.  Agrees to arrange f/u. Warm soaks     Pauline Aus, PA-C 03/24/15 2356  Donnetta Hutching, MD 03/26/15 1351

## 2016-07-02 ENCOUNTER — Emergency Department (HOSPITAL_COMMUNITY)
Admission: EM | Admit: 2016-07-02 | Discharge: 2016-07-02 | Disposition: A | Discharge status: Home / Self Care | Payer: Self-pay | Attending: Emergency Medicine | Admitting: Emergency Medicine

## 2016-07-02 ENCOUNTER — Encounter (HOSPITAL_COMMUNITY): Payer: Self-pay | Admitting: Emergency Medicine

## 2016-07-02 DIAGNOSIS — K611 Rectal abscess: Secondary | ICD-10-CM | POA: Insufficient documentation

## 2016-07-02 DIAGNOSIS — Z79899 Other long term (current) drug therapy: Secondary | ICD-10-CM | POA: Insufficient documentation

## 2016-07-02 DIAGNOSIS — F17210 Nicotine dependence, cigarettes, uncomplicated: Secondary | ICD-10-CM | POA: Insufficient documentation

## 2016-07-02 MED ORDER — CIPROFLOXACIN HCL 250 MG PO TABS
500.0000 mg | ORAL_TABLET | Freq: Once | ORAL | Status: AC
  Administered 2016-07-02: 500 mg via ORAL
  Filled 2016-07-02: qty 2

## 2016-07-02 MED ORDER — CIPROFLOXACIN HCL 500 MG PO TABS: 500.0000 mg | ORAL_TABLET | Freq: Two times a day (BID) | ORAL | 0 refills | Status: DC

## 2016-07-02 MED ORDER — METRONIDAZOLE 500 MG PO TABS: 500.0000 mg | ORAL_TABLET | Freq: Two times a day (BID) | ORAL | 0 refills | Status: DC

## 2016-07-02 MED ORDER — OXYCODONE-ACETAMINOPHEN 5-325 MG PO TABS: 2.0000 | ORAL_TABLET | ORAL | 0 refills | Status: DC | PRN

## 2016-07-02 MED ORDER — METRONIDAZOLE 500 MG PO TABS
500.0000 mg | ORAL_TABLET | Freq: Once | ORAL | Status: AC
  Administered 2016-07-02: 500 mg via ORAL
  Filled 2016-07-02: qty 1

## 2016-07-02 MED ORDER — LIDOCAINE HCL (PF) 2 % IJ SOLN
10.0000 mL | Freq: Once | INTRAMUSCULAR | Status: DC
  Filled 2016-07-02: qty 10

## 2016-07-02 NOTE — ED Triage Notes (Signed)
Painful boil to rectum x 3-4 days

## 2016-07-02 NOTE — ED Provider Notes (Signed)
[facility]-EMERGENCY DEPT Provider Note   CSN: [id number] Arrival date & time: 07/02/16  0431     History   Chief Complaint Chief Complaint  Patient presents with  . Recurrent Skin Infections    HPI Darren White is a 40 y.o. male.  He complains of pain in the right perirectal area for the last 2 days. He feels that there is some swelling there. He has had 2 prior abscesses in that area which have been drained. He denies fever, chills, sweats. He has tried soaking in warm water without any benefit.   The history is provided by the patient.    History reviewed. No pertinent past medical history.  There are no active problems to display for this patient.   Past Surgical History:  Procedure Laterality Date  . FRACTURE SURGERY         Home Medications    Prior to Admission medications   Medication Sig Start Date End Date Taking? Authorizing Provider  ciprofloxacin (CIPRO) 500 MG tablet Take 1 tablet (500 mg total) by mouth 2 (two) times daily. Patient not taking: Reported on 03/24/2015 08/20/14   Ivery Quale, PA-C  cyclobenzaprine (FLEXERIL) 10 MG tablet Take 1 tablet (10 mg total) by mouth 2 (two) times daily as needed for muscle spasms. Patient not taking: Reported on 08/20/2014 05/08/14   Felicie Morn, NP  HYDROcodone-acetaminophen (NORCO) 7.5-325 MG per tablet Take 1 tablet by mouth every 4 (four) hours as needed. Patient not taking: Reported on 03/24/2015 08/20/14   Ivery Quale, PA-C  metroNIDAZOLE (FLAGYL) 500 MG tablet Take 1 tablet (500 mg total) by mouth 2 (two) times daily. Patient not taking: Reported on 03/24/2015 08/20/14   Ivery Quale, PA-C  naproxen sodium (ANAPROX) 220 MG tablet Take 440 mg by mouth 2 (two) times daily as needed (pain).     Historical Provider, MD  oxyCODONE-acetaminophen (PERCOCET/ROXICET) 5-325 MG per tablet Take 1 tablet by mouth every 4 (four) hours as needed. 03/24/15   Tammy Triplett, PA-C  triamcinolone cream (KENALOG) 0.1 % Apply  1 application topically 3 (three) times daily. Patient not taking: Reported on 08/20/2014 07/08/14   Pauline Aus, PA-C    Family History History reviewed. No pertinent family history.  Social History Social History  Substance Use Topics  . Smoking status: Current Every Day Smoker    Packs/day: 2.00    Years: 5.00    Types: Cigarettes  . Smokeless tobacco: Never Used  . Alcohol use Yes     Comment: rarley     Allergies   Aspirin and Tramadol   Review of Systems Review of Systems  All other systems reviewed and are negative.    Physical Exam Updated Vital Signs BP 151/82 (BP Location: Left Arm)   Pulse 97   Temp 98.4 F (36.9 C) (Oral)   Resp 16   Ht 6\' 1"  (1.854 m)   Wt 215 lb (97.5 kg)   SpO2 98%   BMI 28.37 kg/m   Physical Exam  Nursing note and vitals reviewed.  40 year old male, resting comfortably and in no acute distress. Vital signs are Significant for hypertension. Oxygen saturation is 98%, which is normal. Head is normocephalic and atraumatic. PERRLA, EOMI. Oropharynx is clear. Neck is nontender and supple without adenopathy or JVD. Back is nontender and there is no CVA tenderness. Lungs are clear without rales, wheezes, or rhonchi. Chest is nontender. Heart has regular rate and rhythm without murmur. Abdomen is soft, flat, nontender without masses or  hepatosplenomegaly and peristalsis is normoactive. Rectal: Induration and fluctuance noted in the right perianal area. Extremities have no cyanosis or edema, full range of motion is present. Skin is warm and dry without rash. Neurologic: Mental status is normal, cranial nerves are intact, there are no motor or sensory deficits.  ED Treatments / Results   Procedures Procedures (including critical care time) INCISION AND DRAINAGE Performed by: ZOXWR,UEAVWGLICK,Annleigh Knueppel Consent: Verbal consent obtained. Risks and benefits: risks, benefits and alternatives were discussed Type: abscess  Body area: Right  perirectal area  Anesthesia: local infiltration  Incision was made with a scalpel.  Local anesthetic: lidocaine 2% without epinephrine  Anesthetic total: 3 ml  Complexity: complex Blunt dissection to break up loculations  Drainage: purulent, foul-smelling   Drainage amount: Moderate   Packing material: None   Patient tolerance: Patient tolerated the procedure well with no immediate complications.     Medications Ordered in ED Medications  lidocaine (XYLOCAINE) 2 % injection 10 mL (10 mLs Infiltration Handoff 07/02/16 0510)  ciprofloxacin (CIPRO) tablet 500 mg (not administered)  metroNIDAZOLE (FLAGYL) tablet 500 mg (not administered)     Initial Impression / Assessment and Plan / ED Course  I have reviewed the triage vital signs and the nursing notes.  Pertinent labs & imaging results that were available during my care of the patient were reviewed by me and considered in my medical decision making (see chart for details).  Clinical Course   Perirectal abscess. Old records are reviewed showing to prior abscesses in the same area have been drained over the last 2 years. Abscesses drained in the ED and he is referred to general surgery for follow-up.  Final Clinical Impressions(s) / ED Diagnoses   Final diagnoses:  Perirectal abscess    New Prescriptions New Prescriptions   OXYCODONE-ACETAMINOPHEN (PERCOCET/ROXICET) 5-325 MG TABLET    Take 2 tablets by mouth every 4 (four) hours as needed for severe pain.     Dione Boozeavid Kamryn Gauthier, MD 07/02/16 [phone]

## 2016-07-02 NOTE — ED Notes (Signed)
Pt with raised area at lateral anal area - He reports pain 10/10

## 2016-07-02 NOTE — ED Notes (Signed)
Pt discharged at 0538, unable to be discharged from system as registration has chart and this RN cannot access the discharge

## 2016-07-04 MED FILL — Oxycodone w/ Acetaminophen Tab 5-325 MG: ORAL | Qty: 6 | Status: AC

## 2017-07-18 ENCOUNTER — Other Ambulatory Visit: Payer: Self-pay

## 2017-07-18 DIAGNOSIS — Z79899 Other long term (current) drug therapy: Secondary | ICD-10-CM | POA: Insufficient documentation

## 2017-07-18 DIAGNOSIS — T59891A Toxic effect of other specified gases, fumes and vapors, accidental (unintentional), initial encounter: Secondary | ICD-10-CM | POA: Insufficient documentation

## 2017-07-18 DIAGNOSIS — Y929 Unspecified place or not applicable: Secondary | ICD-10-CM | POA: Insufficient documentation

## 2017-07-18 DIAGNOSIS — Y998 Other external cause status: Secondary | ICD-10-CM | POA: Insufficient documentation

## 2017-07-18 DIAGNOSIS — H10212 Acute toxic conjunctivitis, left eye: Secondary | ICD-10-CM | POA: Insufficient documentation

## 2017-07-18 DIAGNOSIS — X58XXXA Exposure to other specified factors, initial encounter: Secondary | ICD-10-CM | POA: Insufficient documentation

## 2017-07-18 DIAGNOSIS — F17210 Nicotine dependence, cigarettes, uncomplicated: Secondary | ICD-10-CM | POA: Insufficient documentation

## 2017-07-18 DIAGNOSIS — Y9389 Activity, other specified: Secondary | ICD-10-CM | POA: Insufficient documentation

## 2017-07-19 ENCOUNTER — Emergency Department (HOSPITAL_COMMUNITY)
Admission: EM | Admit: 2017-07-19 | Discharge: 2017-07-19 | Disposition: A | Discharge status: Home / Self Care | Payer: Self-pay | Attending: Emergency Medicine | Admitting: Emergency Medicine

## 2017-07-19 ENCOUNTER — Encounter (HOSPITAL_COMMUNITY): Payer: Self-pay | Admitting: Emergency Medicine

## 2017-07-19 DIAGNOSIS — R110 Nausea: Secondary | ICD-10-CM

## 2017-07-19 DIAGNOSIS — H10212 Acute toxic conjunctivitis, left eye: Secondary | ICD-10-CM

## 2017-07-19 MED ORDER — ONDANSETRON 4 MG PO TBDP
8.0000 mg | ORAL_TABLET | Freq: Once | ORAL | Status: AC
  Administered 2017-07-19: 8 mg via ORAL
  Filled 2017-07-19: qty 2

## 2017-07-19 MED ORDER — ERYTHROMYCIN 5 MG/GM OP OINT: TOPICAL_OINTMENT | OPHTHALMIC | 0 refills | Status: DC

## 2017-07-19 MED ORDER — ONDANSETRON HCL 4 MG PO TABS: 4.0000 mg | ORAL_TABLET | Freq: Four times a day (QID) | ORAL | 0 refills | Status: DC | PRN

## 2017-07-19 MED ORDER — FLUORESCEIN SODIUM 1 MG OP STRP
1.0000 | ORAL_STRIP | Freq: Once | OPHTHALMIC | Status: DC
  Filled 2017-07-19: qty 1
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
## 2017-11-07 ENCOUNTER — Emergency Department (HOSPITAL_COMMUNITY)
Admission: EM | Admit: 2017-11-07 | Discharge: 2017-11-07 | Disposition: A | Discharge status: Home / Self Care | Payer: Self-pay | Attending: Emergency Medicine | Admitting: Emergency Medicine

## 2017-11-07 ENCOUNTER — Encounter (HOSPITAL_COMMUNITY): Payer: Self-pay | Admitting: Emergency Medicine

## 2017-11-07 ENCOUNTER — Emergency Department (HOSPITAL_COMMUNITY): Payer: Self-pay

## 2017-11-07 ENCOUNTER — Other Ambulatory Visit: Payer: Self-pay

## 2017-11-07 DIAGNOSIS — W010XXA Fall on same level from slipping, tripping and stumbling without subsequent striking against object, initial encounter: Secondary | ICD-10-CM | POA: Insufficient documentation

## 2017-11-07 DIAGNOSIS — Y999 Unspecified external cause status: Secondary | ICD-10-CM | POA: Insufficient documentation

## 2017-11-07 DIAGNOSIS — Z79899 Other long term (current) drug therapy: Secondary | ICD-10-CM | POA: Insufficient documentation

## 2017-11-07 DIAGNOSIS — Y929 Unspecified place or not applicable: Secondary | ICD-10-CM | POA: Insufficient documentation

## 2017-11-07 DIAGNOSIS — S61412A Laceration without foreign body of left hand, initial encounter: Secondary | ICD-10-CM | POA: Insufficient documentation

## 2017-11-07 DIAGNOSIS — F17210 Nicotine dependence, cigarettes, uncomplicated: Secondary | ICD-10-CM | POA: Insufficient documentation

## 2017-11-07 DIAGNOSIS — R51 Headache: Secondary | ICD-10-CM | POA: Insufficient documentation

## 2017-11-07 DIAGNOSIS — Y939 Activity, unspecified: Secondary | ICD-10-CM | POA: Insufficient documentation

## 2017-11-07 DIAGNOSIS — Z23 Encounter for immunization: Secondary | ICD-10-CM | POA: Insufficient documentation

## 2017-11-07 DIAGNOSIS — W19XXXA Unspecified fall, initial encounter: Secondary | ICD-10-CM

## 2017-11-07 LAB — CBC WITH DIFFERENTIAL/PLATELET
BASOS ABS: 0 10*3/uL (ref 0.0–0.1)
Basophils Relative: 0 %
EOS PCT: 10 %
Eosinophils Absolute: 0.9 10*3/uL — ABNORMAL HIGH (ref 0.0–0.7)
HCT: 45.8 % (ref 39.0–52.0)
Hemoglobin: 14.4 g/dL (ref 13.0–17.0)
LYMPHS PCT: 39 %
Lymphs Abs: 3.5 10*3/uL (ref 0.7–4.0)
MCH: 30.4 pg (ref 26.0–34.0)
MCHC: 31.4 g/dL (ref 30.0–36.0)
MCV: 96.8 fL (ref 78.0–100.0)
MONO ABS: 0.5 10*3/uL (ref 0.1–1.0)
MONOS PCT: 6 %
Neutro Abs: 4 10*3/uL (ref 1.7–7.7)
Neutrophils Relative %: 45 %
PLATELETS: 245 10*3/uL (ref 150–400)
RBC: 4.73 MIL/uL (ref 4.22–5.81)
RDW: 12.8 % (ref 11.5–15.5)
WBC: 8.9 10*3/uL (ref 4.0–10.5)

## 2017-11-07 LAB — COMPREHENSIVE METABOLIC PANEL
ALT: 19 U/L (ref 17–63)
ANION GAP: 7 (ref 5–15)
AST: 22 U/L (ref 15–41)
Albumin: 4.2 g/dL (ref 3.5–5.0)
Alkaline Phosphatase: 51 U/L (ref 38–126)
BUN: 11 mg/dL (ref 6–20)
CHLORIDE: 102 mmol/L (ref 101–111)
CO2: 29 mmol/L (ref 22–32)
Calcium: 9.2 mg/dL (ref 8.9–10.3)
Creatinine, Ser: 0.89 mg/dL (ref 0.61–1.24)
Glucose, Bld: 99 mg/dL (ref 65–99)
POTASSIUM: 4.2 mmol/L (ref 3.5–5.1)
Sodium: 138 mmol/L (ref 135–145)
TOTAL PROTEIN: 7.4 g/dL (ref 6.5–8.1)
Total Bilirubin: 0.5 mg/dL (ref 0.3–1.2)

## 2017-11-07 LAB — TROPONIN I

## 2017-11-07 MED ORDER — CEPHALEXIN 500 MG PO CAPS: 500.0000 mg | ORAL_CAPSULE | Freq: Four times a day (QID) | ORAL | 0 refills | Status: DC

## 2017-11-07 MED ORDER — TETANUS-DIPHTH-ACELL PERTUSSIS 5-2.5-18.5 LF-MCG/0.5 IM SUSP
0.5000 mL | Freq: Once | INTRAMUSCULAR | Status: AC
  Administered 2017-11-07: 0.5 mL via INTRAMUSCULAR
  Filled 2017-11-07: qty 0.5

## 2017-11-07 NOTE — ED Provider Notes (Signed)
[facility] EMERGENCY DEPARTMENT Provider Note   CSN: [id number] Arrival date & time: 11/07/17  4098     History   Chief Complaint Chief Complaint  Patient presents with  . Laceration  . 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  Patient states that yesterday he also spots in front of his left eye he felt a little weak and fell towards a door but did not fall completely on the floor.  Patient states he hurt his left hand   The history is provided by the patient.  Laceration   The incident occurred yesterday. Pain location: Left hand. The laceration is 1 cm in size. The pain is at a severity of 1/10. The pain is mild. The pain has been constant since onset. He reports no foreign bodies present. His tetanus status is out of date.    No past medical history on file.  There are no active problems to display for this patient.   Past Surgical History:  Procedure Laterality Date  . FRACTURE SURGERY     hand surgery       Home Medications    Prior to Admission medications   Medication Sig Start Date End Date Taking? Authorizing Provider  naproxen sodium (ANAPROX) 220 MG tablet Take 440 mg by mouth 2 (two) times daily as needed (pain).    Yes [provider]  cephALEXin (KEFLEX) 500 MG capsule Take 1 capsule (500 mg total) by mouth 4 (four) times daily. 11/07/17   Bethann Berkshire, MD    Family History No family history on file.  Social History Social History   Tobacco Use  . Smoking status: Current Every Day Smoker    Packs/day: 2.00    Years: 5.00    Pack years: 10.00    Types: Cigarettes  . Smokeless tobacco: Never Used  Substance Use Topics  . Alcohol use: Yes    Comment: rarley  . Drug use: No     Allergies   Aspirin and Tramadol   Review of Systems Review of Systems  Constitutional: Negative for appetite change and fatigue.  HENT: Negative for congestion, ear discharge and sinus pressure.   Eyes: Negative for discharge.    Respiratory: Negative for cough.   Cardiovascular: Negative for chest pain.  Gastrointestinal: Negative for abdominal pain and diarrhea.  Genitourinary: Negative for frequency and hematuria.  Musculoskeletal: Negative for back pain.       Left hand laceration  Skin: Negative for rash.  Neurological: Positive for headaches. Negative for seizures.  Psychiatric/Behavioral: Negative for hallucinations.     Physical Exam Updated Vital Signs BP 123/61   Pulse (!) 56   Temp 98.4 F (36.9 C) (Oral)   Resp 15   Ht 6\' 1"  (1.854 m)   Wt 99.8 kg (220 lb)   SpO2 99%   BMI 29.03 kg/m   Physical Exam  Constitutional: He is oriented to person, place, and time. He appears well-developed.  HENT:  Head: Normocephalic.  Eyes: Conjunctivae and EOM are normal. No scleral icterus.  Neck: Neck supple. No thyromegaly present.  Cardiovascular: Normal rate and regular rhythm. Exam reveals no gallop and no friction rub.  No murmur heard. Pulmonary/Chest: No stridor. He has no wheezes. He has no rales. He exhibits no tenderness.  Abdominal: He exhibits no distension. There is no tenderness. There is no rebound.  Musculoskeletal: Normal range of motion. He exhibits no edema.  1 cm laceration to the palm of the right  hand which is superficial and beginning to close  Lymphadenopathy:    He has no cervical adenopathy.  Neurological: He is oriented to person, place, and time. He exhibits normal muscle tone. Coordination normal.  Skin: No rash noted. No erythema.  Psychiatric: He has a normal mood and affect. His behavior is normal.     ED Treatments / Results  Labs (all labs ordered are listed, but only abnormal results are displayed) Labs Reviewed  CBC WITH DIFFERENTIAL/PLATELET - Abnormal; Notable for the following components:      Result Value   Eosinophils Absolute 0.9 (*)    All other components within normal limits  COMPREHENSIVE METABOLIC PANEL  TROPONIN I    EKG  EKG  Interpretation  Date/Time:  Wednesday November 07 2017 11:03:33 EST Ventricular Rate:  55 PR Interval:    QRS Duration: 77 QT Interval:  400 QTC Calculation: 383 R Axis:   2 Text Interpretation:  Sinus rhythm Left ventricular hypertrophy Inferior infarct, age indeterminate Tall R wave in V2, consider RVH or PMI Lateral infarct, acute Confirmed by Bethann Berkshire [phone]) on 11/07/2017 1:14:21 PM       Radiology Dg Chest 2 View  Result Date: 11/07/2017 CLINICAL DATA:  Dizziness, weakness, current smoker. EXAM: CHEST  2 VIEW COMPARISON:  Thoracic spine series of June 16, 2012 FINDINGS: The lungs are well-expanded and clear. The heart and pulmonary vascularity are normal. The mediastinum is normal in width. There is no pleural effusion. The trachea is midline. The bony thorax exhibits no acute abnormality. IMPRESSION: There is no active cardiopulmonary disease. Electronically Signed   By: David  [place] M.D.   On: 11/07/2017 12:31   Ct Head Wo Contrast  Result Date: 11/07/2017 CLINICAL DATA:  Pt states had episode of seeing spots in left eye around 4pm. Pt states got dizzy and legs "gave out" hitting hand on glass door and fell. Nausea. Left side headache behind left eye EXAM: CT HEAD WITHOUT CONTRAST TECHNIQUE: Contiguous axial images were obtained from the base of the skull through the vertex without intravenous contrast. COMPARISON:  None. FINDINGS: Brain: No evidence of acute infarction, hemorrhage, hydrocephalus, extra-axial collection or mass lesion/mass effect. Vascular: No hyperdense vessel or unexpected calcification. Skull: No osseous abnormality. Sinuses/Orbits: Visualized paranasal sinuses are clear. Visualized mastoid sinuses are clear. Visualized orbits demonstrate no focal abnormality. Other: None IMPRESSION: 1. No acute intracranial pathology. Electronically Signed   By: Elige Ko   On: 11/07/2017 11:43   Dg Hand Complete Right  Result Date: 11/07/2017 CLINICAL DATA:  Fall,  right hand pain EXAM: RIGHT HAND - COMPLETE 3+ VIEW COMPARISON:  None. FINDINGS: No fracture or dislocation is seen. The joint spaces are preserved. Visualized soft tissues are within normal limits. No radiopaque foreign body is seen. IMPRESSION: Negative. Electronically Signed   By: Charline Bills M.D.   On: 11/07/2017 11:33    Procedures Procedures (including critical care time)  Medications Ordered in ED Medications  Tdap (BOOSTRIX) injection 0.5 mL (0.5 mLs Intramuscular Given 11/07/17 1054)     Initial Impression / Assessment and Plan / ED Course  I have reviewed the triage vital signs and the nursing notes.  Pertinent labs & imaging results that were available during my care of the patient were reviewed by me and considered in my medical decision making (see chart for details).   Labs including CBC troponin chemistries are on remarkable.  CT of the head normal.  Chest x-ray normal EKG showed some strain pattern and also  inverted T waves.  I spoke with cardiology and he will be followed up at some point and possibly get an echo.  Patient is referred to cardiology and family practice.  He will clean his laceration twice a day and is taking Keflex.  Final Clinical Impressions(s) / ED Diagnoses   Final diagnoses:  Fall, initial encounter    ED Discharge Orders        Ordered    cephALEXin (KEFLEX) 500 MG capsule  4 times daily     11/07/17 1353       Bethann BerkshireZammit, Marin Wisner, MD 11/07/17 1401

## 2017-11-07 NOTE — Discharge Instructions (Signed)
Clean laceration twice a day with soap and water.  Follow-up with a family doctor next week or 2.  And follow-up with cardiology in the next month to evaluate your abnormal heart tracing

## 2017-11-07 NOTE — ED Triage Notes (Signed)
Pt states had episode of seeing spots in left eye around 4pm. Pt states got dizzy and legs "gave out" hitting hand on glass door. Pt has small laceration to right hand. Pt states came to ED yesterday for same and left due to wait per pt.

## 2018-10-09 ENCOUNTER — Other Ambulatory Visit: Payer: Self-pay

## 2018-10-09 ENCOUNTER — Emergency Department (HOSPITAL_COMMUNITY)
Admission: EM | Admit: 2018-10-09 | Discharge: 2018-10-10 | Disposition: A | Discharge status: Home / Self Care | Payer: Self-pay | Attending: Emergency Medicine | Admitting: Emergency Medicine

## 2018-10-09 ENCOUNTER — Encounter (HOSPITAL_COMMUNITY): Payer: Self-pay | Admitting: Emergency Medicine

## 2018-10-09 DIAGNOSIS — M79602 Pain in left arm: Secondary | ICD-10-CM

## 2018-10-09 DIAGNOSIS — F17210 Nicotine dependence, cigarettes, uncomplicated: Secondary | ICD-10-CM | POA: Insufficient documentation

## 2018-10-09 DIAGNOSIS — M5410 Radiculopathy, site unspecified: Secondary | ICD-10-CM

## 2018-10-09 NOTE — ED Triage Notes (Signed)
Pt c/o left pinky and middle finger numbness over one week and states whole left arm has been numb since Monday.

## 2018-10-10 MED ORDER — PREDNISONE 10 MG PO TABS: 20.0000 mg | ORAL_TABLET | Freq: Two times a day (BID) | ORAL | 0 refills | Status: AC

## 2018-10-10 MED ORDER — ACETAMINOPHEN 500 MG PO TABS
1000.0000 mg | ORAL_TABLET | Freq: Once | ORAL | Status: AC
  Administered 2018-10-10: 1000 mg via ORAL
  Filled 2018-10-10: qty 2

## 2018-10-10 MED ORDER — HYDROCODONE-ACETAMINOPHEN 5-325 MG PO TABS: 1.0000 | ORAL_TABLET | Freq: Four times a day (QID) | ORAL | 0 refills | Status: DC | PRN

## 2018-10-10 NOTE — Discharge Instructions (Signed)
Prednisone as prescribed. ° °Hydrocodone as prescribed as needed for pain. ° °Follow-up with your primary doctor if symptoms or not improving in the next week, and return to the ER if symptoms significantly worsen or change. °

## 2018-10-10 NOTE — ED Provider Notes (Signed)
[facility] EMERGENCY DEPARTMENT Provider Note   CSN: [id number] Arrival date & time: 10/09/18  2253     History   Chief Complaint Chief Complaint  Patient presents with  . Numbness    HPI Darren White is a 43 y.o. male.  Patient is a 43 year old male with no significant past medical history.  He presents with complaints of a several day history of numbness to his fourth and fifth fingers of his left hand.  2 days ago he began with pain in his left forearm.  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  He denies any neck pain.  He denies any involvement of his face or leg.  The history is provided by the patient.    History reviewed. No pertinent past medical history.  There are no active problems to display for this patient.   Past Surgical History:  Procedure Laterality Date  . FRACTURE SURGERY     hand surgery        Home Medications    Prior to Admission medications   Medication Sig Start Date End Date Taking? Authorizing Provider  cephALEXin (KEFLEX) 500 MG capsule Take 1 capsule (500 mg total) by mouth 4 (four) times daily. 11/07/17   Bethann Berkshire, MD  naproxen sodium (ANAPROX) 220 MG tablet Take 440 mg by mouth 2 (two) times daily as needed (pain).     [provider]    Family History No family history on file.  Social History Social History   Tobacco Use  . Smoking status: Current Every Day Smoker    Packs/day: 2.00    Years: 5.00    Pack years: 10.00    Types: Cigarettes  . Smokeless tobacco: Never Used  Substance Use Topics  . Alcohol use: Yes    Comment: rarley  . Drug use: No     Allergies   Aspirin and Tramadol   Review of Systems Review of Systems  All other systems reviewed and are negative.    Physical Exam Updated Vital Signs BP (!) 157/80 (BP Location: Right Arm)   Pulse 70   Temp 97.7 F (36.5 C) (Oral)   Resp 16   Ht 6\' 1"  (1.854 m)   Wt 99.8 kg    SpO2 99%   BMI 29.03 kg/m   Physical Exam Vitals signs and nursing note reviewed.  Constitutional:      General: He is not in acute distress.    Appearance: Normal appearance. He is not ill-appearing.  HENT:     Head: Normocephalic and atraumatic.  Cardiovascular:     Rate and Rhythm: Normal rate.  Pulmonary:     Effort: Pulmonary effort is normal.  Musculoskeletal:     Comments: Left hand appears grossly normal.  He has easily palpable ulnar and radial pulses.  Strength is 5 out of 5 in both upper and lower extremities.  He does report some decreased sensation to the fourth and fifth fingers of the left hand and does have some pain with range of motion of the left elbow.  He has no cervical spine or cervical soft tissue tenderness.  He has good range of motion of the elbow.  Skin:    General: Skin is warm and dry.  Neurological:     Mental Status: He is alert.      ED Treatments / Results  Labs (all labs ordered are listed, but only abnormal results are displayed) Labs  Reviewed - No data to display  EKG None  Radiology No results found.  Procedures Procedures (including critical care time)  Medications Ordered in ED Medications - No data to display   Initial Impression / Assessment and Plan / ED Course  I have reviewed the triage vital signs and the nursing notes.  Pertinent labs & imaging results that were available during my care of the patient were reviewed by me and considered in my medical decision making (see chart for details).  Patient with left hand numbness that I suspect is some sort of radiculopathy.  His physical examination is essentially unremarkable with the exception of diminished sensation to the left fourth and fifth fingers.  Patient will be treated with a course of prednisone, tramadol, and is to follow-up with primary doctor if not improving.  I highly doubt a stroke and I see no indication for emergent imaging.  Final Clinical Impressions(s)  / ED Diagnoses   Final diagnoses:  None    ED Discharge Orders    None       Geoffery Lyonselo, Kassadi Presswood, MD 10/10/18 [phone]

## 2019-08-28 ENCOUNTER — Encounter (HOSPITAL_COMMUNITY): Payer: Self-pay | Admitting: *Deleted

## 2019-08-28 ENCOUNTER — Other Ambulatory Visit: Payer: Self-pay

## 2019-08-28 ENCOUNTER — Emergency Department (HOSPITAL_COMMUNITY)
Admission: EM | Admit: 2019-08-28 | Discharge: 2019-08-29 | Disposition: A | Discharge status: Home / Self Care | Payer: Self-pay | Attending: Emergency Medicine | Admitting: Emergency Medicine

## 2019-08-28 DIAGNOSIS — F17210 Nicotine dependence, cigarettes, uncomplicated: Secondary | ICD-10-CM | POA: Insufficient documentation

## 2019-08-28 DIAGNOSIS — K610 Anal abscess: Secondary | ICD-10-CM | POA: Insufficient documentation

## 2019-08-28 MED ORDER — HYDROCODONE-ACETAMINOPHEN 5-325 MG PO TABS: 1.0000 | ORAL_TABLET | ORAL | 0 refills | Status: AC | PRN

## 2019-08-28 MED ORDER — CEFTRIAXONE SODIUM 1 G IJ SOLR
1.0000 g | Freq: Once | INTRAMUSCULAR | Status: AC
  Administered 2019-08-29: 1 g via INTRAMUSCULAR
  Filled 2019-08-28: qty 10

## 2019-08-28 MED ORDER — DOXYCYCLINE HYCLATE 100 MG PO CAPS: 100.0000 mg | ORAL_CAPSULE | Freq: Two times a day (BID) | ORAL | 0 refills | Status: AC

## 2019-08-28 MED ORDER — LIDOCAINE-EPINEPHRINE (PF) 1 %-1:200000 IJ SOLN
10.0000 mL | Freq: Once | INTRAMUSCULAR | Status: AC
  Administered 2019-08-28: 10 mL via INTRADERMAL
  Filled 2019-08-28: qty 30

## 2019-08-28 MED ORDER — DOXYCYCLINE HYCLATE 100 MG PO TABS
100.0000 mg | ORAL_TABLET | Freq: Once | ORAL | Status: AC
  Administered 2019-08-29: 100 mg via ORAL
  Filled 2019-08-28: qty 1

## 2019-08-28 MED ORDER — STERILE WATER FOR INJECTION IJ SOLN: 2.0000 mL | Freq: Once | INTRAMUSCULAR | Status: DC

## 2019-08-28 NOTE — ED Triage Notes (Signed)
States he has a boil in rectal area for 2 days
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
## 2019-08-28 NOTE — ED Provider Notes (Signed)
[facility] EMERGENCY DEPARTMENT Provider Note   CSN: [id number] Arrival date & time: 08/28/19  1726     History Chief Complaint  Patient presents with  . Abscess  . Rectal Pain    Darren White is a 43 y.o. male.  Patient presents to the emergency department for evaluation of rectal pain.  Patient reports that this has been ongoing for 1 or 2 weeks.  In the last 2 days pain is worsened.  He has not had any drainage from the site.  Patient reports that he has had a previous perirectal abscess in this position that required incision and drainage.  He has not had any fever.        History reviewed. No pertinent past medical history.  There are no problems to display for this patient.   Past Surgical History:  Procedure Laterality Date  . FRACTURE SURGERY     hand surgery       History reviewed. No pertinent family history.  Social History   Tobacco Use  . Smoking status: Current Every Day Smoker    Packs/day: 2.00    Years: 5.00    Pack years: 10.00    Types: Cigarettes  . Smokeless tobacco: Never Used  Substance Use Topics  . Alcohol use: Yes    Comment: rarley  . Drug use: No    Home Medications Prior to Admission medications   Medication Sig Start Date End Date Taking? Authorizing Provider  HYDROcodone-acetaminophen (NORCO/VICODIN) 5-325 MG tablet Take 1-2 tablets by mouth every 4 (four) hours as needed. 08/28/19   Orpah Greek, MD  predniSONE (DELTASONE) 10 MG tablet Take 2 tablets (20 mg total) by mouth 2 (two) times daily. 10/10/18   Veryl Speak, MD    Allergies    Aspirin and Tramadol  Review of Systems   Review of Systems  Gastrointestinal: Positive for rectal pain.  Skin: Positive for wound.  All other systems reviewed and are negative.   Physical Exam Updated Vital Signs BP (!) 143/83 (BP Location: Right Arm)   Pulse (!) 101   Temp 98.1 F (36.7 C) (Oral)   Resp 18   Ht 6\' 1"  (1.854 m)   Wt 97.5 kg   SpO2 100%    BMI 28.37 kg/m   Physical Exam Vitals and nursing note reviewed.  Constitutional:      General: He is not in acute distress.    Appearance: Normal appearance. He is well-developed.  HENT:     Head: Normocephalic and atraumatic.     Right Ear: Hearing normal.     Left Ear: Hearing normal.     Nose: Nose normal.  Eyes:     Conjunctiva/sclera: Conjunctivae normal.     Pupils: Pupils are equal, round, and reactive to light.  Cardiovascular:     Rate and Rhythm: Regular rhythm.     Heart sounds: S1 normal and S2 normal. No murmur. No friction rub. No gallop.   Pulmonary:     Effort: Pulmonary effort is normal. No respiratory distress.     Breath sounds: Normal breath sounds.  Chest:     Chest wall: No tenderness.  Abdominal:     General: Bowel sounds are normal.     Palpations: Abdomen is soft.     Tenderness: There is no abdominal tenderness. There is no guarding or rebound. Negative signs include Murphy's sign and McBurney's sign.     Hernia: No hernia is present.  Genitourinary:    Rectum: Tenderness  present.    Musculoskeletal:        General: Normal range of motion.     Cervical back: Normal range of motion and neck supple.  Skin:    General: Skin is warm and dry.     Findings: No rash.  Neurological:     Mental Status: He is alert and oriented to person, place, and time.     GCS: GCS eye subscore is 4. GCS verbal subscore is 5. GCS motor subscore is 6.     Cranial Nerves: No cranial nerve deficit.     Sensory: No sensory deficit.     Coordination: Coordination normal.  Psychiatric:        Speech: Speech normal.        Behavior: Behavior normal.        Thought Content: Thought content normal.     ED Results / Procedures / Treatments   Labs (all labs ordered are listed, but only abnormal results are displayed) Labs Reviewed - No data to display  EKG None  Radiology No results found.  Procedures .Marland KitchenIncision and Drainage  Date/Time: 08/28/2019 11:37  PM Performed by: Gilda Crease, MD Authorized by: Gilda Crease, MD   Consent:    Consent obtained:  Verbal   Consent given by:  Patient   Risks discussed:  Bleeding, incomplete drainage and pain Universal protocol:    Procedure explained and questions answered to patient or proxy's satisfaction: yes     Required blood products, implants, devices, and special equipment available: yes     Site/side marked: yes     Immediately prior to procedure a time out was called: yes     Patient identity confirmed:  Verbally with patient Location:    Type:  Abscess   Size:  2cm   Location:  Anogenital   Anogenital location:  Perianal Pre-procedure details:    Skin preparation:  Betadine Anesthesia (see MAR for exact dosages):    Anesthesia method:  Local infiltration   Local anesthetic:  Lidocaine 1% WITH epi Procedure type:    Complexity:  Simple Procedure details:    Incision types:  Single straight   Scalpel blade:  11   Wound management:  Probed and deloculated and irrigated with saline   Drainage:  Purulent   Drainage amount:  Moderate   Wound treatment:  Wound left open Post-procedure details:    Patient tolerance of procedure:  Tolerated well, no immediate complications   (including critical care time)  Medications Ordered in ED Medications  cefTRIAXone (ROCEPHIN) injection 1 g (has no administration in time range)  doxycycline (VIBRA-TABS) tablet 100 mg (has no administration in time range)  lidocaine-EPINEPHrine (XYLOCAINE-EPINEPHrine) 1 %-1:200000 (PF) injection 10 mL (10 mLs Intradermal Given 08/28/19 2310)    ED Course  I have reviewed the triage vital signs and the nursing notes.  Pertinent labs & imaging results that were available during my care of the patient were reviewed by me and considered in my medical decision making (see chart for details).    MDM Rules/Calculators/A&P                      Patient presents with perianal abscess.  He  appears well, abscess appears to be small and external, no concern for extension into the perirectal and deeper pelvic areas.  Incision and drainage performed.  Will discharge on antibiotics.  Follow-up as needed. Final Clinical Impression(s) / ED Diagnoses Final diagnoses:  Perianal abscess  Rx / DC Orders ED Discharge Orders         Ordered    HYDROcodone-acetaminophen (NORCO/VICODIN) 5-325 MG tablet  Every 4 hours PRN     08/28/19 2349           Gilda CreasePollina, Eliah Ozawa J, MD 08/28/19 2349

## 2019-08-29 MED ORDER — STERILE WATER FOR INJECTION IJ SOLN
INTRAMUSCULAR | Status: AC
  Filled 2019-08-29: qty 10

## 2022-06-06 ENCOUNTER — Emergency Department (HOSPITAL_COMMUNITY)
Admission: EM | Admit: 2022-06-06 | Discharge: 2022-06-06 | Disposition: A | Discharge status: Home / Self Care | Payer: Self-pay | Attending: Emergency Medicine | Admitting: Emergency Medicine

## 2022-06-06 ENCOUNTER — Other Ambulatory Visit: Payer: Self-pay

## 2022-06-06 ENCOUNTER — Encounter (HOSPITAL_COMMUNITY): Payer: Self-pay

## 2022-06-06 DIAGNOSIS — K610 Anal abscess: Secondary | ICD-10-CM | POA: Insufficient documentation

## 2022-06-06 DIAGNOSIS — F17210 Nicotine dependence, cigarettes, uncomplicated: Secondary | ICD-10-CM | POA: Insufficient documentation

## 2022-06-06 DIAGNOSIS — L0201 Cutaneous abscess of face: Secondary | ICD-10-CM | POA: Insufficient documentation

## 2022-06-06 MED ORDER — SULFAMETHOXAZOLE-TRIMETHOPRIM 800-160 MG PO TABS: 1.0000 | ORAL_TABLET | Freq: Two times a day (BID) | ORAL | 0 refills | Status: AC

## 2022-06-06 MED ORDER — SULFAMETHOXAZOLE-TRIMETHOPRIM 800-160 MG PO TABS
1.0000 | ORAL_TABLET | Freq: Once | ORAL | Status: AC
  Administered 2022-06-06: 1 via ORAL
  Filled 2022-06-06: qty 1

## 2022-06-06 MED ORDER — LIDOCAINE HCL (PF) 1 % IJ SOLN
5.0000 mL | Freq: Once | INTRAMUSCULAR | Status: AC
  Administered 2022-06-06: 5 mL
  Filled 2022-06-06: qty 5

## 2022-06-06 NOTE — ED Provider Notes (Signed)
[facility] Emergency Department Provider Note MRN:  [id number]  Arrival date & time: 06/06/22     Chief Complaint   Abscess   History of Present Illness   Darren White is a 46 y.o. year-old male with no pertinent past medical history presenting to the ED with chief complaint of abscess.  Abscess to the right side of the face within the beard for the past several days.  Also has an abscess near the anus for the past couple days.  No fever, no other complaints.  The anal abscess is actively draining this evening.  Review of Systems  A thorough review of systems was obtained and all systems are negative except as noted in the HPI and PMH.   Patient's Health History   History reviewed. No pertinent past medical history.  Past Surgical History:  Procedure Laterality Date   FRACTURE SURGERY     hand surgery    History reviewed. No pertinent family history.  Social History   Socioeconomic History   Marital status: Single    Spouse name: Not on file   Number of children: Not on file   Years of education: Not on file   Highest education level: Not on file  Occupational History   Not on file  Tobacco Use   Smoking status: Every Day    Packs/day: 2.00    Years: 5.00    Total pack years: 10.00    Types: Cigarettes   Smokeless tobacco: Never  Vaping Use   Vaping Use: Never used  Substance and Sexual Activity   Alcohol use: Yes    Comment: [email or web address]   Drug use: No   Sexual activity: Not on file  Other Topics Concern   Not on file  Social History Narrative   Not on file   Social Determinants of Health   Financial Resource Strain: Not on file  Food Insecurity: Not on file  Transportation Needs: Not on file  Physical Activity: Not on file  Stress: Not on file  Social Connections: Not on file  Intimate Partner Violence: Not on file     Physical Exam   Vitals:   06/06/22 0132  BP: (!) 148/55  Pulse: 75  Resp: 18  Temp: 98 F (36.7 C)   SpO2: 92%    CONSTITUTIONAL: Well-appearing, NAD NEURO/PSYCH:  Alert and oriented x 3, no focal deficits EYES:  eyes equal and reactive ENT/NECK:  no LAD, no JVD CARDIO: Regular rate, well-perfused, normal S1 and S2 PULM:  CTAB no wheezing or rhonchi GI/GU:  non-distended, non-tender MSK/SPINE:  No gross deformities, no edema SKIN:  no rash, atraumatic   *Additional and/or pertinent findings included in MDM below  Diagnostic and Interventional Summary    EKG Interpretation  Date/Time:    Ventricular Rate:    PR Interval:    QRS Duration:   QT Interval:    QTC Calculation:   R Axis:     Text Interpretation:         Labs Reviewed - No data to display  No orders to display    Medications  sulfamethoxazole-trimethoprim (BACTRIM DS) 800-160 MG per tablet 1 tablet (has no administration in time range)  lidocaine (PF) (XYLOCAINE) 1 % injection 5 mL (5 mLs Infiltration Given 06/06/22 0502)     Procedures  /  Critical Care .Marland KitchenIncision and Drainage  Date/Time: 06/06/2022 5:11 AM  Performed by: Maudie Flakes, MD Authorized by: Maudie Flakes, MD   Consent:  Consent obtained:  Verbal   Consent given by:  Patient   Risks, benefits, and alternatives were discussed: yes     Risks discussed:  Bleeding, damage to other organs, infection, incomplete drainage and pain   Alternatives discussed: Antibiotics alone. Universal protocol:    Procedure explained and questions answered to patient or proxy's satisfaction: yes     Immediately prior to procedure, a time out was called: yes     Patient identity confirmed:  Verbally with patient Location:    Type:  Abscess   Size:  1.5cm   Location: Right face. Pre-procedure details:    Skin preparation:  Chlorhexidine with alcohol Sedation:    Sedation type:  None Anesthesia:    Anesthesia method:  Local infiltration   Local anesthetic:  Lidocaine 1% w/o epi Procedure type:    Complexity:  Complex Procedure details:     Incision types:  Single straight   Incision depth:  Subcutaneous   Wound management:  Probed and deloculated, irrigated with saline and debrided   Drainage:  Purulent   Drainage amount:  Scant   Wound treatment:  Wound left open Post-procedure details:    Procedure completion:  Tolerated well, no immediate complications   ED Course and Medical Decision Making  Initial Impression and Ddx There is a tender mildly fluctuant nodule to the right cheek within the beard suspicious for small follicular abscess.  On exam there is also a left-sided perianal abscess that is actively draining copious purulent material.  The perianal abscess seems to be self resolving.  Discussed management options regarding the facial abscess, patient is made aware of the cosmetic risks of scarring within the I&D to the face, agrees to simple I&D.  Past medical/surgical history that increases complexity of ED encounter: None  Interpretation of Diagnostics Laboratory and/or imaging options to aid in the diagnosis/care of the patient were considered.  After careful history and physical examination, it was determined that there was no indication for diagnostics at this time.  Patient Reassessment and Ultimate Disposition/Management     Discharge  Patient management required discussion with the following services or consulting groups:  None  Complexity of Problems Addressed Acute complicated illness or Injury  Additional Data Reviewed and Analyzed Further history obtained from: None  Additional Factors Impacting ED Encounter Risk Prescriptions and Minor Procedures  Elmer Sow. Pilar Plate, MD [facility] Emergency Medicine [facility] [email or web address]  Final Clinical Impressions(s) / ED Diagnoses     ICD-10-CM   1. Facial abscess  L02.01     2. Perianal abscess  K61.0       ED Discharge Orders          Ordered    sulfamethoxazole-trimethoprim (BACTRIM DS) 800-160 MG tablet  2 times  daily        06/06/22 0510             Discharge Instructions Discussed with and Provided to Patient:     Discharge Instructions      You were evaluated in the Emergency Department and after careful evaluation, we did not find any emergent condition requiring admission or further testing in the hospital.  Your exam/testing today is overall reassuring.  Symptoms seem to be due to an abscess.  We drained your facial abscess here in the emergency department.  Your other abscess is actively draining.  To help with healing of both areas, take the Bactrim antibiotic as directed.  Please return to the Emergency Department if you experience any  worsening of your condition.   Thank you for allowing [place] to be a part of your care.       Sabas Sous, MD 06/06/22 [phone]

## 2022-06-06 NOTE — ED Triage Notes (Signed)
Pov from home. Abscess to face and rectum  for a few days.

## 2022-06-06 NOTE — Discharge Instructions (Signed)
You were evaluated in the Emergency Department and after careful evaluation, we did not find any emergent condition requiring admission or further testing in the hospital.  Your exam/testing today is overall reassuring.  Symptoms seem to be due to an abscess.  We drained your facial abscess here in the emergency department.  Your other abscess is actively draining.  To help with healing of both areas, take the Bactrim antibiotic as directed.  Please return to the Emergency Department if you experience any worsening of your condition.   Thank you for allowing [place] to be a part of your care.
